# Patient Record
Sex: Female | Born: 2000 | Hispanic: No | Marital: Single | Smoking: Never smoker
Health system: Southern US, Community
[De-identification: ages and names within clinical notes are randomized; demographics above are authoritative.]

---
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
## 2022-05-31 ENCOUNTER — Other Ambulatory Visit: Payer: Self-pay

## 2022-05-31 ENCOUNTER — Inpatient Hospital Stay (HOSPITAL_COMMUNITY)
Admission: EM | Admit: 2022-05-31 | Discharge: 2022-06-04 | DRG: 872 | Disposition: A | Discharge status: Home / Self Care | Payer: Medicaid Other | Attending: Internal Medicine | Admitting: Internal Medicine

## 2022-05-31 ENCOUNTER — Emergency Department (HOSPITAL_COMMUNITY): Payer: Medicaid Other

## 2022-05-31 ENCOUNTER — Encounter (HOSPITAL_COMMUNITY): Payer: Self-pay

## 2022-05-31 DIAGNOSIS — N3090 Cystitis, unspecified without hematuria: Secondary | ICD-10-CM | POA: Diagnosis present

## 2022-05-31 DIAGNOSIS — E876 Hypokalemia: Secondary | ICD-10-CM | POA: Diagnosis present

## 2022-05-31 DIAGNOSIS — R000 Tachycardia, unspecified: Secondary | ICD-10-CM | POA: Diagnosis not present

## 2022-05-31 DIAGNOSIS — N179 Acute kidney failure, unspecified: Secondary | ICD-10-CM | POA: Diagnosis present

## 2022-05-31 DIAGNOSIS — N12 Tubulo-interstitial nephritis, not specified as acute or chronic: Secondary | ICD-10-CM | POA: Diagnosis present

## 2022-05-31 DIAGNOSIS — A419 Sepsis, unspecified organism: Secondary | ICD-10-CM | POA: Diagnosis not present

## 2022-05-31 DIAGNOSIS — R739 Hyperglycemia, unspecified: Secondary | ICD-10-CM | POA: Diagnosis not present

## 2022-05-31 DIAGNOSIS — Z833 Family history of diabetes mellitus: Secondary | ICD-10-CM | POA: Diagnosis not present

## 2022-05-31 DIAGNOSIS — D649 Anemia, unspecified: Secondary | ICD-10-CM | POA: Diagnosis not present

## 2022-05-31 DIAGNOSIS — E872 Acidosis, unspecified: Secondary | ICD-10-CM | POA: Diagnosis not present

## 2022-05-31 DIAGNOSIS — Z1152 Encounter for screening for COVID-19: Secondary | ICD-10-CM | POA: Diagnosis not present

## 2022-05-31 DIAGNOSIS — R109 Unspecified abdominal pain: Secondary | ICD-10-CM | POA: Diagnosis not present

## 2022-05-31 DIAGNOSIS — N151 Renal and perinephric abscess: Secondary | ICD-10-CM | POA: Diagnosis present

## 2022-05-31 DIAGNOSIS — R059 Cough, unspecified: Secondary | ICD-10-CM | POA: Diagnosis not present

## 2022-05-31 DIAGNOSIS — R519 Headache, unspecified: Secondary | ICD-10-CM | POA: Diagnosis not present

## 2022-05-31 DIAGNOSIS — R6520 Severe sepsis without septic shock: Secondary | ICD-10-CM | POA: Diagnosis not present

## 2022-05-31 DIAGNOSIS — R079 Chest pain, unspecified: Secondary | ICD-10-CM | POA: Diagnosis not present

## 2022-05-31 DIAGNOSIS — N3289 Other specified disorders of bladder: Secondary | ICD-10-CM | POA: Diagnosis not present

## 2022-05-31 DIAGNOSIS — R509 Fever, unspecified: Secondary | ICD-10-CM | POA: Diagnosis not present

## 2022-05-31 LAB — COMPREHENSIVE METABOLIC PANEL
ALT: 16 U/L (ref 0–44)
AST: 29 U/L (ref 15–41)
Albumin: 4 g/dL (ref 3.5–5.0)
Alkaline Phosphatase: 72 U/L (ref 38–126)
Anion gap: 14 (ref 5–15)
BUN: 12 mg/dL (ref 6–20)
CO2: 21 mmol/L — ABNORMAL LOW (ref 22–32)
Calcium: 9.4 mg/dL (ref 8.9–10.3)
Chloride: 99 mmol/L (ref 98–111)
Creatinine, Ser: 1.31 mg/dL — ABNORMAL HIGH (ref 0.44–1.00)
GFR, Estimated: 60 mL/min — ABNORMAL LOW (ref >60)
Glucose, Bld: 190 mg/dL — ABNORMAL HIGH (ref 70–99)
Potassium: 3.3 mmol/L — ABNORMAL LOW (ref 3.5–5.1)
Sodium: 134 mmol/L — ABNORMAL LOW (ref 135–145)
Total Bilirubin: 1.1 mg/dL (ref 0.3–1.2)
Total Protein: 7.9 g/dL (ref 6.5–8.1)

## 2022-05-31 LAB — LIPASE, BLOOD: Lipase: 25 U/L (ref 11–51)

## 2022-05-31 LAB — CBC WITH DIFFERENTIAL/PLATELET
Abs Immature Granulocytes: 0.26 10*3/uL — ABNORMAL HIGH (ref 0.00–0.07)
Basophils Absolute: 0 10*3/uL (ref 0.0–0.1)
Basophils Relative: 0 %
Eosinophils Absolute: 0.1 10*3/uL (ref 0.0–0.5)
Eosinophils Relative: 0 %
HCT: 39.7 % (ref 36.0–46.0)
Hemoglobin: 13.2 g/dL (ref 12.0–15.0)
Immature Granulocytes: 1 %
Lymphocytes Relative: 15 %
Lymphs Abs: 3.2 10*3/uL (ref 0.7–4.0)
MCH: 30.6 pg (ref 26.0–34.0)
MCHC: 33.2 g/dL (ref 30.0–36.0)
MCV: 92.1 fL (ref 80.0–100.0)
Monocytes Absolute: 0.6 10*3/uL (ref 0.1–1.0)
Monocytes Relative: 3 %
Neutro Abs: 16.7 10*3/uL — ABNORMAL HIGH (ref 1.7–7.7)
Neutrophils Relative %: 81 %
Platelets: 287 10*3/uL (ref 150–400)
RBC: 4.31 MIL/uL (ref 3.87–5.11)
RDW: 12.5 % (ref 11.5–15.5)
WBC: 20.8 10*3/uL — ABNORMAL HIGH (ref 4.0–10.5)
nRBC: 0 % (ref 0.0–0.2)

## 2022-05-31 LAB — URINALYSIS, ROUTINE W REFLEX MICROSCOPIC
Bilirubin Urine: NEGATIVE
Glucose, UA: NEGATIVE mg/dL
Ketones, ur: 20 mg/dL — AB
Nitrite: NEGATIVE
Protein, ur: 100 mg/dL — AB
RBC / HPF: >50 RBC/hpf — ABNORMAL HIGH (ref 0–5)
Specific Gravity, Urine: 1.02 (ref 1.005–1.030)
WBC, UA: >50 WBC/hpf — ABNORMAL HIGH (ref 0–5)
pH: 5 (ref 5.0–8.0)

## 2022-05-31 LAB — LACTIC ACID, PLASMA
Lactic Acid, Venous: 4.3 mmol/L (ref 0.5–1.9)
Lactic Acid, Venous: 2 mmol/L (ref 0.5–1.9)

## 2022-05-31 LAB — RESP PANEL BY RT-PCR (FLU A&B, COVID) ARPGX2
Influenza A by PCR: NEGATIVE
Influenza B by PCR: NEGATIVE
SARS Coronavirus 2 by RT PCR: NEGATIVE

## 2022-05-31 LAB — I-STAT BETA HCG BLOOD, ED (MC, WL, AP ONLY): I-stat hCG, quantitative: <5 m[IU]/mL (ref <5)

## 2022-05-31 MED ORDER — LACTATED RINGERS IV SOLN: INTRAVENOUS | Status: AC

## 2022-05-31 MED ORDER — ACETAMINOPHEN 500 MG PO TABS
1000.0000 mg | ORAL_TABLET | Freq: Once | ORAL | Status: AC
  Administered 2022-05-31: 1000 mg via ORAL
  Filled 2022-05-31: qty 2

## 2022-05-31 MED ORDER — SODIUM CHLORIDE 0.9 % IV SOLN
1.0000 g | INTRAVENOUS | Status: DC
  Administered 2022-06-01: 1 g via INTRAVENOUS
  Filled 2022-05-31: qty 10

## 2022-05-31 MED ORDER — ACETAMINOPHEN 650 MG RE SUPP: 650.0000 mg | Freq: Four times a day (QID) | RECTAL | Status: DC | PRN

## 2022-05-31 MED ORDER — LACTATED RINGERS IV BOLUS (SEPSIS)
250.0000 mL | Freq: Once | INTRAVENOUS | Status: AC
  Administered 2022-05-31: 250 mL via INTRAVENOUS

## 2022-05-31 MED ORDER — LACTATED RINGERS IV BOLUS
1000.0000 mL | Freq: Once | INTRAVENOUS | Status: AC
  Administered 2022-05-31: 1000 mL via INTRAVENOUS

## 2022-05-31 MED ORDER — ONDANSETRON HCL 4 MG PO TABS: 4.0000 mg | ORAL_TABLET | Freq: Four times a day (QID) | ORAL | Status: DC | PRN

## 2022-05-31 MED ORDER — ENOXAPARIN SODIUM 40 MG/0.4ML IJ SOSY
40.0000 mg | PREFILLED_SYRINGE | INTRAMUSCULAR | Status: DC
  Administered 2022-05-31 – 2022-06-03 (×3): 40 mg via SUBCUTANEOUS
  Filled 2022-05-31 (×3): qty 0.4

## 2022-05-31 MED ORDER — CEFTRIAXONE SODIUM 1 G IJ SOLR: 1.0000 g | Freq: Once | INTRAMUSCULAR | Status: DC

## 2022-05-31 MED ORDER — SODIUM CHLORIDE 0.9 % IV SOLN
2.0000 g | Freq: Once | INTRAVENOUS | Status: AC
  Administered 2022-05-31: 2 g via INTRAVENOUS
  Filled 2022-05-31: qty 20

## 2022-05-31 MED ORDER — ACETAMINOPHEN 500 MG PO TABS: 1000.0000 mg | ORAL_TABLET | Freq: Once | ORAL | Status: DC

## 2022-05-31 MED ORDER — ONDANSETRON HCL 4 MG/2ML IJ SOLN
4.0000 mg | Freq: Four times a day (QID) | INTRAMUSCULAR | Status: DC | PRN
  Administered 2022-05-31: 4 mg via INTRAVENOUS
  Filled 2022-05-31: qty 2

## 2022-05-31 MED ORDER — IOHEXOL 350 MG/ML SOLN
75.0000 mL | Freq: Once | INTRAVENOUS | Status: AC | PRN
  Administered 2022-05-31: 75 mL via INTRAVENOUS

## 2022-05-31 MED ORDER — ACETAMINOPHEN 325 MG PO TABS
650.0000 mg | ORAL_TABLET | Freq: Four times a day (QID) | ORAL | Status: DC | PRN
  Administered 2022-05-31 – 2022-06-04 (×8): 650 mg via ORAL
  Filled 2022-05-31 (×8): qty 2

## 2022-05-31 MED ORDER — LACTATED RINGERS IV BOLUS
500.0000 mL | Freq: Once | INTRAVENOUS | Status: AC
  Administered 2022-05-31: 500 mL via INTRAVENOUS

## 2022-05-31 MED ORDER — LACTATED RINGERS IV BOLUS (SEPSIS)
500.0000 mL | Freq: Once | INTRAVENOUS | Status: AC
  Administered 2022-05-31: 500 mL via INTRAVENOUS

## 2022-05-31 NOTE — ED Provider Notes (Incomplete)
[facility] EMERGENCY DEPARTMENT Provider Note   CSN: [id number] Arrival date & time: 05/31/22  1238     History {Add pertinent medical, surgical, social history, OB history to HPI:1} Chief Complaint  Patient presents with   Abdominal Pain    Colleen Young is a 21 y.o. female.  Patient with no significant past medical history presents to the emergency department today for fever, headache, vomiting.  Symptoms started several days ago.  History obtained from patient as well as sister-in-law at bedside.  They report that took a home test for UTI about 2 weeks ago and treated this with over-the-counter Azo.  She reports having some abdominal pain recently, but not currently present.  She reports generalized headache.  No associated confusion, weakness, numbness, or tingling in the arms or the legs.  She reported taking a home UTI test yesterday which was positive.  No cough, shortness of breath or difficulty breathing.  No diarrhea or constipation.  No skin rashes.  No recent travel, suspicious food or water exposures.  No known sick contacts.      Home Medications Prior to Admission medications   Not on File      Allergies    Patient has no known allergies.    Review of Systems   Review of Systems  Physical Exam Updated Vital Signs BP (!) 105/58   Pulse (!) 172   Temp (!) 102.4 F (39.1 C)   Resp 15   Ht 4\' 11"  (1.499 m)   SpO2 98%  Physical Exam Vitals and nursing note reviewed.  Constitutional:      General: She is not in acute distress.    Appearance: She is well-developed.     Comments: Patient diaphoretic but did just receive Tylenol for fever.  HENT:     Head: Normocephalic and atraumatic.     Right Ear: External ear normal.     Left Ear: External ear normal.     Nose: Nose normal.     Mouth/Throat:     Mouth: Mucous membranes are moist.  Eyes:     Extraocular Movements: Extraocular movements intact.     Conjunctiva/sclera: Conjunctivae  normal.  Neck:     Meningeal: Brudzinski's sign and Kernig's sign absent.     Comments: Patient with full range of motion of neck without difficulty, no meningeal signs. Cardiovascular:     Rate and Rhythm: Regular rhythm. Tachycardia present.     Heart sounds: No murmur heard. Pulmonary:     Effort: No respiratory distress.     Breath sounds: No wheezing, rhonchi or rales.  Abdominal:     Palpations: Abdomen is soft.     Tenderness: There is no abdominal tenderness. There is no right CVA tenderness, left CVA tenderness, guarding or rebound. Negative signs include Murphy's sign and McBurney's sign.  Musculoskeletal:     Cervical back: Normal range of motion and neck supple.     Right lower leg: No edema.     Left lower leg: No edema.  Skin:    General: Skin is warm and dry.     Findings: No rash.  Neurological:     General: No focal deficit present.     Mental Status: She is alert. Mental status is at baseline.     Motor: No weakness.  Psychiatric:        Mood and Affect: Mood normal.    ED Results / Procedures / Treatments   Labs (all labs ordered are listed,  but only abnormal results are displayed) Labs Reviewed  CBC WITH DIFFERENTIAL/PLATELET - Abnormal; Notable for the following components:      Result Value   WBC 20.8 (*)    Neutro Abs 16.7 (*)    Abs Immature Granulocytes 0.26 (*)    All other components within normal limits  LACTIC ACID, PLASMA - Abnormal; Notable for the following components:   Lactic Acid, Venous 4.3 (*)    All other components within normal limits  RESP PANEL BY RT-PCR (FLU A&B, COVID) ARPGX2  CULTURE, BLOOD (ROUTINE X 2)  CULTURE, BLOOD (ROUTINE X 2)  COMPREHENSIVE METABOLIC PANEL  LIPASE, BLOOD  URINALYSIS, ROUTINE W REFLEX MICROSCOPIC  LACTIC ACID, PLASMA  I-STAT BETA HCG BLOOD, ED ([facility], [facility], [facility] ONLY)    EKG EKG Interpretation  Date/Time:  Sunday May 31 2022 12:51:31 EST Ventricular Rate:  172 PR Interval:  82 QRS  Duration: 78 QT Interval:  246 QTC Calculation: 416 R Axis:   88 Text Interpretation: Sinus tachycardia with short PR Marked ST abnormality, possible inferior subendocardial injury Abnormal ECG No previous ECGs available no prior ECG for comaprison. No STEMI Confirmed by Theda Belfast ([id number]) on 05/31/2022 1:11:51 PM  Radiology DG Chest 2 View  Result Date: 05/31/2022 CLINICAL DATA:  Cough and fever.  Abdominal pain for 2 days. EXAM: CHEST - 2 VIEW COMPARISON:  08/21/2008. FINDINGS: Normal heart, mediastinum and hila. Clear lungs.  No pleural effusion or pneumothorax. Skeletal structures are within normal limits. IMPRESSION: Normal chest radiographs. Electronically Signed   By: Amie Portland M.D.   On: 05/31/2022 13:48    Procedures Procedures  {Document cardiac monitor, telemetry assessment procedure when appropriate:1}  Medications Ordered in ED Medications  cefTRIAXone (ROCEPHIN) 1 g in sodium chloride 0.9 % 100 mL IVPB (has no administration in time range)  lactated ringers bolus 500 mL (has no administration in time range)    And  lactated ringers bolus 250 mL (has no administration in time range)  acetaminophen (TYLENOL) tablet 1,000 mg (1,000 mg Oral Given 05/31/22 1304)  lactated ringers bolus 1,000 mL (1,000 mLs Intravenous New Bag/Given 05/31/22 1309)    ED Course/ Medical Decision Making/ A&P    Patient seen and examined. History obtained directly from patient.   Labs/EKG: Ordered sepsis work-up including UA and COVID/flu.  Imaging: Personally reviewed and interpreted chest x-ray, agree negative.  Medications/Fluids: Ordered: IV fluid bolus.   Most recent vital signs reviewed and are as follows: BP (!) 105/58   Pulse (!) 172   Temp (!) 102.4 F (39.1 C)   Resp 15   Ht 4\' 11"  (1.499 m)   SpO2 98%   Initial impression: Sepsis, unclear cause  1:54 PM Reassessment performed. Patient appears stable.  Labs personally reviewed and interpreted including: CBC  with white blood cell count 20.8, normal hemoglobin; lactate 4.3 indicating severe sepsis; flu and COVID-negative; pregnancy negative.  Reviewed pertinent lab work and imaging with patient at bedside. Questions answered.   Most current vital signs reviewed and are as follows: BP (!) 105/58   Pulse (!) 172   Temp (!) 102.4 F (39.1 C)   Resp 15   Ht 4\' 11"  (1.499 m)   SpO2 98%   Plan: Code sepsis ordered, will ensure 30 cc/kg fluid bolus.  1 L ordered, patient will likely need around 1750 cc.  Awaiting remainder of labs.  Patient discussed with and seen by Dr. , agrees no meningeal signs at this time.  3:35 PM Reassessment performed.  Patient appears stable, heart rate improved to 110.  Reviewed pertinent lab work and imaging with patient at bedside. Questions answered.   Most current vital signs reviewed and are as follows: BP (!) 91/54   Pulse (!) 104   Temp (!) 102.4 F (39.1 C)   Resp (!) 36   Ht 4\' 11"  (1.499 m)   Wt 49.9 kg   SpO2 99%   BMI 22.22 kg/m   Plan: Admit after UA.  Patient aware of need for UA.   CRITICAL Young Performed by: PA-C Total critical Young time: 40 minutes Critical Young time was exclusive of separately billable procedures and treating other patients. Critical Young was necessary to treat or prevent imminent or life-threatening deterioration. Critical Young was time spent personally by me on the following activities: development of treatment plan with patient and/or surrogate as well as nursing, discussions with consultants, evaluation of patient's response to treatment, examination of patient, obtaining history from patient or surrogate, ordering and performing treatments and interventions, ordering and review of laboratory studies, ordering and review of radiographic studies, pulse oximetry and re-evaluation of patient's condition.                           Medical Decision Making  Patient presents  {Document critical Young  time when appropriate:1} {Document review of labs and clinical decision tools ie heart score, Chads2Vasc2 etc:1}  {Document your independent review of radiology images, and any outside records:1} {Document your discussion with family members, caretakers, and with consultants:1} {Document social determinants of health affecting pt's Young:1} {Document your decision making why or why not admission, treatments were needed:1} Final Clinical Impression(s) / ED Diagnoses Final diagnoses:  Sepsis, due to unspecified organism, unspecified whether acute organ dysfunction present [facility])    Rx / DC Orders ED Discharge Orders     None

## 2022-05-31 NOTE — ED Notes (Signed)
The patient reports she just started her menstrual cycle today when she was in the restroom providing her urine sample. Dr. Lajean Silvius informed.

## 2022-05-31 NOTE — Hospital Course (Addendum)
Here with her sister in law. Headache, fever, headache. Symptoms all started 3 weeks ago. She had a uti then per at home test, hadn't previously had any. Tested because she had burning with urination. She started taking azo which helped burning sensation. She doesn't remember what happened before she came in but SIL says she had high fever last night and they tested for uti at home again it was positive. SIL gave her cystex at home took 2 last night and tylenol and ibuprofen. Fever as high as 104.4. no abdominal pain, yes n/v--threw up a few hours ago. Has had increased frequency. No dysuria right now. No back pain. Is still nauseous. Appetite is decreased. Has had chills.  No pmh No surgeries No home medications Social hx: no alcohol, no tobacco, no illicit substances. Lives with parents and brother. Has two brothers Family hx:m and d have diabetes, no other history  Is ill appearing, speaking softly  Menstruating now No vitamins No possibility for stis Been a while since seeing pcp  Full code  Headache is worst sx right now. Friend says she was a little altered this morning. Didn't know how she got here.

## 2022-05-31 NOTE — ED Provider Triage Note (Signed)
Emergency Medicine Provider Triage Evaluation Note  Nevah Ondrea Dow , a 21 y.o. female  was evaluated in triage.  Pt complains of fever.  Has been feeling unwell over the last few days.  Patient with headache, nausea, vomiting, dysuria.  No sick contacts however did recently travel to [facility].  No out of country travel.  She has diffuse abdominal pain.  No neck pain, neck rigidity  Review of Systems  Positive: Fever, headache, abdominal pain, dysuria Negative: Neck pain, neck stiffness  Physical Exam  BP (!) 105/58   Pulse (!) 172   Temp (!) 102.4 F (39.1 C)   Resp 15   Ht 4\' 11"  (1.499 m)   SpO2 98%  Gen:   Awake, pale. Rigors, diaphoretic  Neck:  Full ROM without difficulty Resp:  Normal effort  Cardiac: tachycardic MSK:   Moves extremities without difficulty  ABD:  Diffusely tender Other:    Medical Decision Making  Medically screening exam initiated at 12:52 PM.  Appropriate orders placed.  Riva Ma Shifrin was informed that the remainder of the evaluation will be completed by another provider, this initial triage assessment does not replace that evaluation, and the importance of remaining in the ED until their evaluation is complete.  Fever  Patient significantly tachycardic into 160's, pale, diaphoretic, rigors.  Nursing aware patient needs room in back   Loran Auguste A, PA-C 05/31/22 1253

## 2022-05-31 NOTE — ED Provider Notes (Signed)
  Assumption of Care    I assumed care of Colleen Young on 05/31/2022 at 3 PM from [place], [place].   Briefly, Colleen Young is a 21 y.o. female who presented for fever, mild UTI symptoms. The signout from the previous ED provider included: Clinical Course as of 05/31/22 1730  Sun May 31, 2022  1538 Sepsis, likely UTI, minor urinary sxs. VS improved after IVFs, S/p Rocephin. Will need admission. If UA unrevealing consider CTAP.  [DG]  1652 CTAP  1. Bilateral striated nephrograms, right greater than left, compatible with pyelonephritis. Lobar nephronia versus small abscess noted within the anterior cortex of the upper pole of right kidney. 2. Mild bladder wall thickening with urothelial enhancement compatible with cystitis. [DG]    Clinical Course User Index [DG] Stephanie Coup, MD     Please refer to the original provider's note for additional information regarding the care of Colleen Young.  Reassessment: Vital Signs:  The most current vitals were  Vitals:   05/31/22 1615 05/31/22 1723  BP: (!) 85/61 (!) 100/52  Pulse:  89  Resp: (!) 26 (!) 22  Temp:    SpO2:  99%    Physical Exam:  Hemodynamics:  The patient has soft but overall stable BP.  Mental Status:  The patient is alert. PE: No acute distress.  Appears slightly fatigued, but overall nontoxic-appearing.  No significant abdominal tenderness to palpation.   Additional MDM: On my initial assessment, patient appears stable and states that she feels a bit better at this time.  No significant abdominal pain.  She states that she wears tampons during menstruation, but has not used one for several weeks and does not currently have one.  She has no vaginal discharge or bleeding. Will obtain CTAP to further evaluate for possible pyelo or other infection.   CTAP obtained, demonstrates pyelonephritis and possible sent home right renal abscess, cystitis, independent review and review by radiology.  UA with negative nitrites, moderate  leukocytes, greater than 50 WBCs, rare bacteria, greater than 50 RBCs, patient reports she is currently menstruating, overall c/w UTI.  Lactic acid appropriately downtrending from 4.3-2.0 after 30 cc/KG IV fluids.  Blood pressure has gotten a bit soft again, 80s/50s, will administer another 500 cc of IV fluids.  This did improve her blood pressure, which I suspect is low at baseline given her young age and low weight.  Patient overall well-appearing.  Patient admitted to medicine teaching service for further management.  The plan for this patient was discussed with Dr. Silverio Lay, who voiced agreement and who oversaw evaluation and treatment of this patient.    Diagnosis: 1. Sepsis, due to unspecified organism, unspecified whether acute organ dysfunction present (HCC)   2. Pyelonephritis            Stephanie Coup, MD 05/31/22 1730    Charlynne Pander, MD 05/31/22 [phone]

## 2022-05-31 NOTE — ED Notes (Signed)
Provider notified that pt lactic is 4.3.

## 2022-05-31 NOTE — ED Notes (Signed)
Patient transported to CT 

## 2022-05-31 NOTE — ED Notes (Signed)
Pt ambulatory to restroom with independent steady gait °

## 2022-05-31 NOTE — H&P (Cosign Needed Addendum)
Date: 05/31/2022               Patient Name:  Colleen Young MRN: [id number]  DOB: [birth year] Age / Sex: 21 y.o., female   PCP: Pcp, No         Medical Service: Internal Medicine Teaching Service         Attending Physician: Dr. Dickie La, MD    First Contact: Adron Bene, MD Pager: Myriam Jacobson [phone]  Second Contact: Champ Mungo, DO Pager: ED [phone]       After Hours (After 5p/  First Contact Pager: [phone]  weekends / holidays): Second Contact Pager: [phone]   SUBJECTIVE   Chief Complaint: fever, dysuria  History of Present Illness: Colleen Young is a 21 y.o. female with no significant past medical history who presented to the ED with fever, vomiting, and dysuria. Here with her sister in law. Currently endorses headache and fever. Symptoms all started 2 weeks ago. She had a uti per at home test, hadn't previously had any. Tested because she had burning with urination. She started taking azo which helped burning sensation. She doesn't remember what happened before she came in but SIL says she had high fever last night and they tested for uti at home again and it was positive. SIL gave her cystex at home, took 2 last night as well as tylenol and ibuprofen. Endorses fever as high as 104.4. No abdominal pain, just  n/v-- says she threw up a few hours ago. Has had increased frequency. No dysuria right now. No back pain. Is still nauseous. Appetite is decreased. Has had chills. Headache is worst sx right now. SIL says she was a little altered this morning and that she doesn't know how she got here.  Denies any potential for STIs. Currently menstruating (as of today) but denies hematuria before today. No vitamins. States it's been a while since seeing pcp. She is a full code.  ED Course: Initially with fever, tachycardia, hypotension. CTAP with pyelonephritis. UA with negative nitrites, moderate leukocytes, greater than 50 WBCs, rare bacteria, greater than 50 RBCs, patient reports she is currently  menstruating, overall c/w UTI.  Lactic acid appropriately downtrending from 4.3-2.0 after 30 cc/KG IV fluids. Received Rocephin 2 g. BP improved with fluids. Temperature reduced with Rocephin.  PMHx None  Medications No home medications  Surgical Hx No surgeries  Social hx No alcohol, no tobacco, no illicit substances. Lives with parents and brother. Has two brothers PCP: None  Family hx Mother and father have diabetes, no other history that she is aware of  Allergies: Allergies as of 05/31/2022   (No Known Allergies)    Review of Systems: A complete ROS was negative except as per HPI.   OBJECTIVE:   Physical Exam: Blood pressure (!) 85/61, pulse (!) 101, temperature 100.1 F (37.8 C), temperature source Oral, resp. rate (!) 26, height 4\' 11"  (1.499 m), weight 49.9 kg, SpO2 99 %.  Constitutional: ill-appearing young female lying in hospital bed, in no acute distress HENT: normocephalic atraumatic, mucous membranes moist Eyes: conjunctiva non-erythematous Neck: supple Cardiovascular: regular rate and rhythm, no m/r/g Pulmonary/Chest: normal work of breathing on room air, lungs clear to auscultation bilaterally Abdominal: soft, non-tender, non-distended MSK: normal bulk and tone Neurological: alert & oriented x 3 Skin: warm and dry Psych: Normal mood and affect.  Labs: CBC    Component Value Date/Time   WBC 20.8 (H) 05/31/2022 1301   RBC 4.31 05/31/2022 1301   HGB 13.2 05/31/2022  1301   HCT 39.7 05/31/2022 1301   PLT 287 05/31/2022 1301   MCV 92.1 05/31/2022 1301   MCH 30.6 05/31/2022 1301   MCHC 33.2 05/31/2022 1301   RDW 12.5 05/31/2022 1301   LYMPHSABS 3.2 05/31/2022 1301   MONOABS 0.6 05/31/2022 1301   EOSABS 0.1 05/31/2022 1301   BASOSABS 0.0 05/31/2022 1301     CMP     Component Value Date/Time   NA 134 (L) 05/31/2022 1301   K 3.3 (L) 05/31/2022 1301   CL 99 05/31/2022 1301   CO2 21 (L) 05/31/2022 1301   GLUCOSE 190 (H) 05/31/2022 1301   BUN  12 05/31/2022 1301   CREATININE 1.31 (H) 05/31/2022 1301   CALCIUM 9.4 05/31/2022 1301   PROT 7.9 05/31/2022 1301   ALBUMIN 4.0 05/31/2022 1301   AST 29 05/31/2022 1301   ALT 16 05/31/2022 1301   ALKPHOS 72 05/31/2022 1301   BILITOT 1.1 05/31/2022 1301   GFRNONAA 60 (L) 05/31/2022 1301    Imaging: CT ABDOMEN PELVIS W CONTRAST  Result Date: 05/31/2022 CLINICAL DATA:  Urinary tract infection. Complains of nausea, fever and headache and abdominal pain for 2 days. Diaphoretic and pale. EXAM: CT ABDOMEN AND PELVIS WITH CONTRAST TECHNIQUE: Multidetector CT imaging of the abdomen and pelvis was performed using the standard protocol following bolus administration of intravenous contrast. RADIATION DOSE REDUCTION: This exam was performed according to the departmental dose-optimization program which includes automated exposure control, adjustment of the mA and/or kV according to patient size and/or use of iterative reconstruction technique. CONTRAST:  79mL OMNIPAQUE IOHEXOL 350 MG/ML SOLN COMPARISON:  None Available. FINDINGS: Lower chest: No acute abnormality. Hepatobiliary: No focal liver abnormality is seen. No gallstones, gallbladder wall thickening, or biliary dilatation. Pancreas: Unremarkable. No pancreatic ductal dilatation or surrounding inflammatory changes. Spleen: Normal in size without focal abnormality. Adrenals/Urinary Tract: Normal appearance of the adrenal glands. No nephrolithiasis. Bilateral striated nephrograms are identified, right greater than left. Lobar nephronia versus small abscess noted within the anterior cortex of the upper pole of right kidney measures 1.2 x 2.3 cm, image 33/3. Right-sided pelvocaliectasis. Bilateral urothelial enhancement is noted involving both ureters. Partially decompressed bladder with mild wall thickening. Stomach/Bowel: Stomach appears normal. The appendix is visualized and is within normal limits. No bowel wall thickening, inflammation or distension.  Vascular/Lymphatic: Normal appearance of the abdominal aorta. No sign of abdominopelvic adenopathy. Reproductive: Uterus and bilateral adnexa are unremarkable. Other: No abdominal wall hernia or abnormality. No abdominopelvic ascites. Musculoskeletal: No acute or significant osseous findings. IMPRESSION: 1. Bilateral striated nephrograms, right greater than left, compatible with pyelonephritis. Lobar nephronia versus small abscess noted within the anterior cortex of the upper pole of right kidney. 2. Mild bladder wall thickening with urothelial enhancement compatible with cystitis. Electronically Signed   By: Signa Kell M.D.   On: 05/31/2022 16:48   DG Chest 2 View  Result Date: 05/31/2022 CLINICAL DATA:  Cough and fever.  Abdominal pain for 2 days. EXAM: CHEST - 2 VIEW COMPARISON:  08/21/2008. FINDINGS: Normal heart, mediastinum and hila. Clear lungs.  No pleural effusion or pneumothorax. Skeletal structures are within normal limits. IMPRESSION: Normal chest radiographs. Electronically Signed   By: Amie Portland M.D.   On: 05/31/2022 13:48    EKG: personally reviewed my interpretation is sinus tachycardia  ASSESSMENT & PLAN:   Assessment & Plan by Problem: Principal Problem:   Pyelonephritis   Colleen Young is a 21 y.o. female with no significant past medical history who presented  to the ED with fever, vomiting, and dysuria admitted for bilateral pyelonephritis.   #Sepsis 2/2 Bilateral pyelonephritis #Cystitis #Possible AKI Patient presents with 2 weeks of fever and dysuria. WBC 20.8, febrile. UA negative for nitrite, mod leukocytes, >50 WBC, >50 RBC. Patient is menstruating. CTAP with bilateral pyelonephritis, lobar nephronia vs small abscess at rt kidney, and cystitis. Clinically improving after receiving IV Rocephin and fluids. Lactate downtrending. BP improving. Blood, urine cultures pending. -continue IV rocephin 1g daily, narrow pending sensitivities -maintenance LR -f/u urine,  blood cultures -daily CBC  Possible AKI Cr 1.31, possible AKI in setting of UTI and reduced po intake, but no baseline. Receiving IVF. -trend Cr  Hypokalemia Potassium 3.3 in setting of reduced po intake. -maintenance LR -daily BMP  Diet: Normal VTE: Enoxaparin IVF: LR,Bolus Code: Full  Prior to Admission Living Arrangement: Home, living with parents and brother Anticipated Discharge Location: Home Barriers to Discharge: medical management  Dispo: Admit patient to Inpatient with expected length of stay greater than 2 midnights.  Signed: Adron Bene, MD Internal Medicine Resident PGY-1  05/31/2022, 6:41 PM

## 2022-05-31 NOTE — Sepsis Progress Note (Signed)
Sepsis protocol is being followed by [facility]. 

## 2022-05-31 NOTE — ED Triage Notes (Signed)
Pt arrived POV from home c/o N/V, a fever, a headache and some belly pain x2 days. Pt is diaphoretic and pale. Pt also endorses some burning with urination.

## 2022-06-01 DIAGNOSIS — N12 Tubulo-interstitial nephritis, not specified as acute or chronic: Secondary | ICD-10-CM | POA: Diagnosis not present

## 2022-06-01 LAB — COMPREHENSIVE METABOLIC PANEL
ALT: 14 U/L (ref 0–44)
AST: 21 U/L (ref 15–41)
Albumin: 2.7 g/dL — ABNORMAL LOW (ref 3.5–5.0)
Alkaline Phosphatase: 51 U/L (ref 38–126)
Anion gap: 9 (ref 5–15)
BUN: 6 mg/dL (ref 6–20)
CO2: 23 mmol/L (ref 22–32)
Calcium: 8.2 mg/dL — ABNORMAL LOW (ref 8.9–10.3)
Chloride: 106 mmol/L (ref 98–111)
Creatinine, Ser: 0.7 mg/dL (ref 0.44–1.00)
GFR, Estimated: >60 mL/min (ref >60)
Glucose, Bld: 96 mg/dL (ref 70–99)
Potassium: 3.5 mmol/L (ref 3.5–5.1)
Sodium: 138 mmol/L (ref 135–145)
Total Bilirubin: 0.5 mg/dL (ref 0.3–1.2)
Total Protein: 5.6 g/dL — ABNORMAL LOW (ref 6.5–8.1)

## 2022-06-01 LAB — HIV ANTIBODY (ROUTINE TESTING W REFLEX): HIV Screen 4th Generation wRfx: NONREACTIVE

## 2022-06-01 LAB — CBC
HCT: 32.1 % — ABNORMAL LOW (ref 36.0–46.0)
Hemoglobin: 10.6 g/dL — ABNORMAL LOW (ref 12.0–15.0)
MCH: 30.5 pg (ref 26.0–34.0)
MCHC: 33 g/dL (ref 30.0–36.0)
MCV: 92.5 fL (ref 80.0–100.0)
Platelets: 187 10*3/uL (ref 150–400)
RBC: 3.47 MIL/uL — ABNORMAL LOW (ref 3.87–5.11)
RDW: 12.8 % (ref 11.5–15.5)
WBC: 18.5 10*3/uL — ABNORMAL HIGH (ref 4.0–10.5)
nRBC: 0 % (ref 0.0–0.2)

## 2022-06-01 MED ORDER — IBUPROFEN 400 MG PO TABS
400.0000 mg | ORAL_TABLET | ORAL | Status: DC | PRN
  Administered 2022-06-01 – 2022-06-04 (×3): 400 mg via ORAL
  Filled 2022-06-01 (×3): qty 1

## 2022-06-01 MED ORDER — POLYETHYLENE GLYCOL 3350 17 G PO PACK: 17.0000 g | PACK | Freq: Every day | ORAL | Status: DC | PRN

## 2022-06-01 MED ORDER — PIPERACILLIN-TAZOBACTAM 3.375 G IVPB 30 MIN
3.3750 g | Freq: Once | INTRAVENOUS | Status: AC
  Administered 2022-06-01: 3.375 g via INTRAVENOUS
  Filled 2022-06-01: qty 50

## 2022-06-01 MED ORDER — LACTATED RINGERS IV BOLUS
1000.0000 mL | Freq: Once | INTRAVENOUS | Status: AC
  Administered 2022-06-01: 1000 mL via INTRAVENOUS

## 2022-06-01 MED ORDER — MELATONIN 3 MG PO TABS: 3.0000 mg | ORAL_TABLET | Freq: Every evening | ORAL | Status: DC | PRN

## 2022-06-01 MED ORDER — PIPERACILLIN-TAZOBACTAM 3.375 G IVPB
3.3750 g | Freq: Three times a day (TID) | INTRAVENOUS | Status: DC
  Administered 2022-06-02 – 2022-06-03 (×5): 3.375 g via INTRAVENOUS
  Filled 2022-06-01 (×8): qty 50

## 2022-06-01 NOTE — Progress Notes (Addendum)
HD#1 Subjective:   Summary: Colleen Young is a 21 y.o. female with no significant past medical history who presented to the ED with fever, vomiting, and dysuria.   Overnight Events: A few low MAPs (59, 61) got 1L bolus LR, now 70s to 80s. Febrile to 104, now afebrile.  Patient states she feels better today. Still with mild headache though much improved. Endorses a little bit of dizziness. Endorses good amount of voiding. No back pain or abdominal pain. Has neck pain--points to center upper C spine. Had some chills this morning around 5am but none currently. Last BM ~5 days ago. Doesn't normally go very often so this isn't necessarily abnormal for her.   Objective:  Vital signs in last 24 hours: Vitals:   06/01/22 0800 06/01/22 1006 06/01/22 1030 06/01/22 1100  BP: 115/63 (!) 97/55 (!) 100/58 105/62  Pulse: 83 67 61 66  Resp: (!) 21 (!) 26 20 (!) 22  Temp:  99.1 F (37.3 C)    TempSrc:  Oral    SpO2: 96% 100% 100% 99%  Weight:      Height:       Supplemental O2: Room Air SpO2: 99 %   Physical Exam:  Constitutional: mildly ill-appearing female sitting in hospital bed, in no acute distress, non-diaphoretic HENT: normocephalic atraumatic, mucous membranes moist Eyes: conjunctiva non-erythematous Neck: supple, no pain when touching chin to chest, no pain to palpation of c-spine Cardiovascular: regular rate and rhythm, no m/r/g Pulmonary/Chest: normal work of breathing on room air, lungs clear to auscultation bilaterally Abdominal: soft, non-distended, pain with palpation of right mid/upper quadrant, no rebound or guarding MSK: normal bulk and tone Neurological: alert & oriented x 3 Skin: warm and dry, normal skin turgor Psych: Normal mood and affect  Filed Weights   05/31/22 1512  Weight: 49.9 kg     Intake/Output Summary (Last 24 hours) at 06/01/2022 1151 Last data filed at 06/01/2022 0442 Gross per 24 hour  Intake 953.06 ml  Output --  Net 953.06 ml   Net IO  Since Admission: 953.06 mL [06/01/22 1151]  Pertinent Labs:    Latest Ref Rng & Units 06/01/2022    4:40 AM 05/31/2022    1:01 PM  CBC  WBC 4.0 - 10.5 K/uL 18.5  20.8   Hemoglobin 12.0 - 15.0 g/dL 35.7  01.7   Hematocrit 36.0 - 46.0 % 32.1  39.7   Platelets 150 - 400 K/uL 187  287        Latest Ref Rng & Units 06/01/2022    4:40 AM 05/31/2022    1:01 PM  CMP  Glucose 70 - 99 mg/dL 96  793   BUN 6 - 20 mg/dL 6  12   Creatinine 9.03 - 1.00 mg/dL 0.09  2.33   Sodium 007 - 145 mmol/L 138  134   Potassium 3.5 - 5.1 mmol/L 3.5  3.3   Chloride 98 - 111 mmol/L 106  99   CO2 22 - 32 mmol/L 23  21   Calcium 8.9 - 10.3 mg/dL 8.2  9.4   Total Protein 6.5 - 8.1 g/dL 5.6  7.9   Total Bilirubin 0.3 - 1.2 mg/dL 0.5  1.1   Alkaline Phos 38 - 126 U/L 51  72   AST 15 - 41 U/L 21  29   ALT 0 - 44 U/L 14  16     Imaging: CT ABDOMEN PELVIS W CONTRAST  Result Date: 05/31/2022 CLINICAL DATA:  Urinary  tract infection. Complains of nausea, fever and headache and abdominal pain for 2 days. Diaphoretic and pale. EXAM: CT ABDOMEN AND PELVIS WITH CONTRAST TECHNIQUE: Multidetector CT imaging of the abdomen and pelvis was performed using the standard protocol following bolus administration of intravenous contrast. RADIATION DOSE REDUCTION: This exam was performed according to the departmental dose-optimization program which includes automated exposure control, adjustment of the mA and/or kV according to patient size and/or use of iterative reconstruction technique. CONTRAST:  37mL OMNIPAQUE IOHEXOL 350 MG/ML SOLN COMPARISON:  None Available. FINDINGS: Lower chest: No acute abnormality. Hepatobiliary: No focal liver abnormality is seen. No gallstones, gallbladder wall thickening, or biliary dilatation. Pancreas: Unremarkable. No pancreatic ductal dilatation or surrounding inflammatory changes. Spleen: Normal in size without focal abnormality. Adrenals/Urinary Tract: Normal appearance of the adrenal glands. No  nephrolithiasis. Bilateral striated nephrograms are identified, right greater than left. Lobar nephronia versus small abscess noted within the anterior cortex of the upper pole of right kidney measures 1.2 x 2.3 cm, image 33/3. Right-sided pelvocaliectasis. Bilateral urothelial enhancement is noted involving both ureters. Partially decompressed bladder with mild wall thickening. Stomach/Bowel: Stomach appears normal. The appendix is visualized and is within normal limits. No bowel wall thickening, inflammation or distension. Vascular/Lymphatic: Normal appearance of the abdominal aorta. No sign of abdominopelvic adenopathy. Reproductive: Uterus and bilateral adnexa are unremarkable. Other: No abdominal wall hernia or abnormality. No abdominopelvic ascites. Musculoskeletal: No acute or significant osseous findings. IMPRESSION: 1. Bilateral striated nephrograms, right greater than left, compatible with pyelonephritis. Lobar nephronia versus small abscess noted within the anterior cortex of the upper pole of right kidney. 2. Mild bladder wall thickening with urothelial enhancement compatible with cystitis. Electronically Signed   By: Signa Kell M.D.   On: 05/31/2022 16:48   DG Chest 2 View  Result Date: 05/31/2022 CLINICAL DATA:  Cough and fever.  Abdominal pain for 2 days. EXAM: CHEST - 2 VIEW COMPARISON:  08/21/2008. FINDINGS: Normal heart, mediastinum and hila. Clear lungs.  No pleural effusion or pneumothorax. Skeletal structures are within normal limits. IMPRESSION: Normal chest radiographs. Electronically Signed   By: Amie Portland M.D.   On: 05/31/2022 13:48    Assessment/Plan:   Principal Problem:   Pyelonephritis   Patient Summary: Colleen Young is a 21 y.o. female with no significant past medical history who presented to the ED with fever, vomiting, and dysuria admitted for sepsis 2/2 bilateral pyelonephritis.   Bilateral Pyelonephritis with nephronia vs abscess Cystitis Sepsis,  resolved Patient presents with 2 weeks of fever and dysuria. WBC stable, afebrile (though did have fever briefly overnight which responded to tylenol). Clinically improving with IV Rocephin and fluids (3.25 L so far). HDS now. Awaiting culture results. Will treat as complicated UTI with 10-14 day abx course. If her condition does not continue to improve, will consider IR consult to assess need for abscess drainage.  -continue IV rocephin 1g daily, narrow pending sensitivities -Tylenol for fever -zofran for nausea -holding IVF -f/u urine, blood cultures -daily CBC  AKI Cr improved with 1.31 to 0.70 with IVF. -IVF prn -daily BMP  Hypokalemia Corrected with LR. -daily BMP  Hyperglycemia No history of diabetes.  -A1c pending  Diet: Normal IVF: LR,100cc/hr VTE: Enoxaparin Code: Full Family Update: SIL   Dispo: Anticipated discharge to Home in 3 days pending medical management of pyelonephritis.   Adron Bene MD Internal Medicine Resident PGY-1 Please contact the on call pager after 5 pm and on weekends at [phone].

## 2022-06-01 NOTE — ED Notes (Signed)
Pt is reporting headache. Temp spiked to 104 orally and was give tylenol but it did not help with her headache. Dr. Cliffton Asters informed via epic secure chat.

## 2022-06-01 NOTE — Progress Notes (Signed)
Pt ambulate from stretcher to bed. Pt A&Ox4 . Bed in lowest position with bed alarms in place, personal items and call bell within reach.

## 2022-06-01 NOTE — ED Notes (Signed)
ED TO INPATIENT HANDOFF REPORT  ED Nurse Name and Phone #: Morrie Sheldon RN [phone]   S Name/Age/Gender Colleen Young 21 y.o. female Room/Bed: 018C/018C  Code Status   Code Status: Full Code  Home/SNF/Other Home Patient oriented to: self, place, time, and situation Is this baseline? Yes   Triage Complete: Triage complete  Chief Complaint Pyelonephritis [N12]  Triage Note Pt arrived POV from home c/o N/V, a fever, a headache and some belly pain x2 days. Pt is diaphoretic and pale. Pt also endorses some burning with urination.    Allergies No Known Allergies  Level of Care/Admitting Diagnosis ED Disposition     ED Disposition  Admit   Condition  --   Comment  Hospital Area: [facility] [[id number]]  Level of Care: Progressive [102]  Admit to Progressive based on following criteria: MULTISYSTEM THREATS such as stable sepsis, metabolic/electrolyte imbalance with or without encephalopathy that is responding to early treatment.  May admit patient to [facility] or [facility] if equivalent level of care is available:: No  Covid Evaluation: Asymptomatic - no recent exposure (last 10 days) testing not required  Diagnosis: Pyelonephritis [540086]  Admitting Physician: Dickie La [[id number]]  Attending Physician: Dickie La [[id number]]  Certification:: I certify this patient will need inpatient services for at least 2 midnights  Estimated Length of Stay: 2          B Medical/Surgery History History reviewed. No pertinent past medical history. History reviewed. No pertinent surgical history.   A IV Location/Drains/Wounds Patient Lines/Drains/Airways Status     Active Line/Drains/Airways     Name Placement date Placement time Site Days   Peripheral IV 05/31/22 20 G Left Antecubital 05/31/22  1451  Antecubital  1            Intake/Output Last 24 hours  Intake/Output Summary (Last 24 hours) at 06/01/2022 1647 Last data filed at 06/01/2022 0442 Gross  per 24 hour  Intake 953.06 ml  Output --  Net 953.06 ml    Labs/Imaging Results for orders placed or performed during the hospital encounter of 05/31/22 (from the past 48 hour(s))  Urinalysis, Routine w reflex microscopic Urine, Clean Catch     Status: Abnormal   Collection Time: 05/31/22 12:44 PM  Result Value Ref Range   Color, Urine AMBER (A) YELLOW    Comment: BIOCHEMICALS MAY BE AFFECTED BY COLOR   APPearance CLOUDY (A) CLEAR   Specific Gravity, Urine 1.020 1.005 - 1.030   pH 5.0 5.0 - 8.0   Glucose, UA NEGATIVE NEGATIVE mg/dL   Hgb urine dipstick LARGE (A) NEGATIVE   Bilirubin Urine NEGATIVE NEGATIVE   Ketones, ur 20 (A) NEGATIVE mg/dL   Protein, ur 580 (A) NEGATIVE mg/dL   Nitrite NEGATIVE NEGATIVE   Leukocytes,Ua MODERATE (A) NEGATIVE   RBC / HPF >50 (H) 0 - 5 RBC/hpf   WBC, UA >50 (H) 0 - 5 WBC/hpf   Bacteria, UA RARE (A) NONE SEEN   Squamous Epithelial / LPF 0-5 0 - 5   WBC Clumps PRESENT    Mucus PRESENT     Comment: Performed at [facility], [address] [place], [place] [address]  Resp Panel by RT-PCR (Flu A&B, Covid) Anterior Nasal Swab     Status: None   Collection Time: 05/31/22 12:47 PM   Specimen: Anterior Nasal Swab  Result Value Ref Range   SARS Coronavirus 2 by RT PCR NEGATIVE NEGATIVE    Comment: (NOTE) SARS-CoV-2 target nucleic  acids are NOT DETECTED.  The SARS-CoV-2 RNA is generally detectable in upper respiratory specimens during the acute phase of infection. The lowest concentration of SARS-CoV-2 viral copies this assay can detect is 138 copies/mL. A negative result does not preclude SARS-Cov-2 infection and should not be used as the sole basis for treatment or other patient management decisions. A negative result may occur with  improper specimen collection/handling, submission of specimen other than nasopharyngeal swab, presence of viral mutation(s) within the areas targeted by this assay, and inadequate number of viral copies(<138  copies/mL). A negative result must be combined with clinical observations, patient history, and epidemiological information. The expected result is Negative.  Fact Sheet for Patients:  BloggerCourse.com  Fact Sheet for Healthcare Providers:  SeriousBroker.it  This test is no t yet approved or cleared by the [place] FDA and  has been authorized for detection and/or diagnosis of SARS-CoV-2 by FDA under an Emergency Use Authorization (EUA). This EUA will remain  in effect (meaning this test can be used) for the duration of the COVID-19 declaration under Section 564(b)(1) of the Act, 21 U.S.C.section 360bbb-3(b)(1), unless the authorization is terminated  or revoked sooner.       Influenza A by PCR NEGATIVE NEGATIVE   Influenza B by PCR NEGATIVE NEGATIVE    Comment: (NOTE) The Xpert Xpress SARS-CoV-2/FLU/RSV plus assay is intended as an aid in the diagnosis of influenza from Nasopharyngeal swab specimens and should not be used as a sole basis for treatment. Nasal washings and aspirates are unacceptable for Xpert Xpress SARS-CoV-2/FLU/RSV testing.  Fact Sheet for Patients: BloggerCourse.com  Fact Sheet for Healthcare Providers: SeriousBroker.it  This test is not yet approved or cleared by the [place] FDA and has been authorized for detection and/or diagnosis of SARS-CoV-2 by FDA under an Emergency Use Authorization (EUA). This EUA will remain in effect (meaning this test can be used) for the duration of the COVID-19 declaration under Section 564(b)(1) of the Act, 21 U.S.C. section 360bbb-3(b)(1), unless the authorization is terminated or revoked.  Performed at [facility], [address] [place], [place] [address]   Lactic acid, plasma     Status: Abnormal   Collection Time: 05/31/22 12:57 PM  Result Value Ref Range   Lactic Acid, Venous 4.3 (HH) 0.5 - 1.9  mmol/L    Comment: CRITICAL RESULT CALLED TO, READ BACK BY AND VERIFIED WITH M,FRANCIS RN @1351  05/31/22 E,BENTON Performed at [facility], [address], [place], [place]   CBC with Differential     Status: Abnormal   Collection Time: 05/31/22  1:01 PM  Result Value Ref Range   WBC 20.8 (H) 4.0 - 10.5 K/uL   RBC 4.31 3.87 - 5.11 MIL/uL   Hemoglobin 13.2 12.0 - 15.0 g/dL   HCT 13/12/23 27.0 - 62.3 %   MCV 92.1 80.0 - 100.0 fL   MCH 30.6 26.0 - 34.0 pg   MCHC 33.2 30.0 - 36.0 g/dL   RDW 76.2 83.1 - 51.7 %   Platelets 287 150 - 400 K/uL   nRBC 0.0 0.0 - 0.2 %   Neutrophils Relative % 81 %   Neutro Abs 16.7 (H) 1.7 - 7.7 K/uL   Lymphocytes Relative 15 %   Lymphs Abs 3.2 0.7 - 4.0 K/uL   Monocytes Relative 3 %   Monocytes Absolute 0.6 0.1 - 1.0 K/uL   Eosinophils Relative 0 %   Eosinophils Absolute 0.1 0.0 - 0.5 K/uL   Basophils  Relative 0 %   Basophils Absolute 0.0 0.0 - 0.1 K/uL   Immature Granulocytes 1 %   Abs Immature Granulocytes 0.26 (H) 0.00 - 0.07 K/uL    Comment: Performed at [facility], [address] [place], [place] [address]  Comprehensive metabolic panel     Status: Abnormal   Collection Time: 05/31/22  1:01 PM  Result Value Ref Range   Sodium 134 (L) 135 - 145 mmol/L   Potassium 3.3 (L) 3.5 - 5.1 mmol/L   Chloride 99 98 - 111 mmol/L   CO2 21 (L) 22 - 32 mmol/L   Glucose, Bld 190 (H) 70 - 99 mg/dL    Comment: Glucose reference range applies only to samples taken after fasting for at least 8 hours.   BUN 12 6 - 20 mg/dL   Creatinine, Ser 4.09 (H) 0.44 - 1.00 mg/dL   Calcium 9.4 8.9 - 81.1 mg/dL   Total Protein 7.9 6.5 - 8.1 g/dL   Albumin 4.0 3.5 - 5.0 g/dL   AST 29 15 - 41 U/L   ALT 16 0 - 44 U/L   Alkaline Phosphatase 72 38 - 126 U/L   Total Bilirubin 1.1 0.3 - 1.2 mg/dL   GFR, Estimated 60 (L) >60 mL/min    Comment: (NOTE) Calculated using the CKD-EPI Creatinine Equation (2021)    Anion gap 14 5 - 15    Comment: Performed at  [facility], [address] [place], [place] [address]  Lipase, blood     Status: None   Collection Time: 05/31/22  1:01 PM  Result Value Ref Range   Lipase 25 11 - 51 U/L    Comment: Performed at [facility], [address] [place], [place] [address]  Blood culture (routine x 2)     Status: None (Preliminary result)   Collection Time: 05/31/22  1:01 PM   Specimen: BLOOD  Result Value Ref Range   Specimen Description BLOOD RIGHT ANTECUBITAL    Special Requests      BOTTLES DRAWN AEROBIC AND ANAEROBIC Blood Culture adequate volume   Culture      NO GROWTH < 24 HOURS Performed at [facility], [address] [place], [place] [address]    Report Status PENDING   I-Stat Beta hCG blood, ED ([facility], [facility], [facility] only)     Status: None   Collection Time: 05/31/22  1:09 PM  Result Value Ref Range   I-stat hCG, quantitative <5.0 <5 mIU/mL   Comment 3            Comment:   GEST. AGE      CONC.  (mIU/mL)   <=1 WEEK        5 - 50     2 WEEKS       50 - 500     3 WEEKS       100 - 10,000     4 WEEKS     1,000 - 30,000        FEMALE AND NON-PREGNANT FEMALE:     LESS THAN 5 mIU/mL   Lactic acid, plasma     Status: Abnormal   Collection Time: 05/31/22  2:12 PM  Result Value Ref Range   Lactic Acid, Venous 2.0 (HH) 0.5 - 1.9 mmol/L    Comment: CRITICAL VALUE NOTED. VALUE IS CONSISTENT WITH PREVIOUSLY REPORTED/CALLED VALUE Performed at [facility], [address], [place], [place] [address]   Blood  culture (routine x 2)     Status: None (Preliminary result)   Collection Time: 05/31/22  2:47 PM   Specimen: BLOOD LEFT ARM  Result Value Ref Range   Specimen Description BLOOD LEFT ARM    Special Requests      BOTTLES DRAWN AEROBIC AND ANAEROBIC Blood Culture results may not be optimal due to an excessive volume of blood received in culture bottles   Culture      NO GROWTH < 24 HOURS Performed at [facility], [address], [place], [place] [address]    Report  Status PENDING   Comprehensive metabolic panel     Status: Abnormal   Collection Time: 06/01/22  4:40 AM  Result Value Ref Range   Sodium 138 135 - 145 mmol/L   Potassium 3.5 3.5 - 5.1 mmol/L   Chloride 106 98 - 111 mmol/L   CO2 23 22 - 32 mmol/L   Glucose, Bld 96 70 - 99 mg/dL    Comment: Glucose reference range applies only to samples taken after fasting for at least 8 hours.   BUN 6 6 - 20 mg/dL   Creatinine, Ser 6.38 0.44 - 1.00 mg/dL   Calcium 8.2 (L) 8.9 - 10.3 mg/dL   Total Protein 5.6 (L) 6.5 - 8.1 g/dL   Albumin 2.7 (L) 3.5 - 5.0 g/dL   AST 21 15 - 41 U/L   ALT 14 0 - 44 U/L   Alkaline Phosphatase 51 38 - 126 U/L   Total Bilirubin 0.5 0.3 - 1.2 mg/dL   GFR, Estimated >75 >64 mL/min    Comment: (NOTE) Calculated using the CKD-EPI Creatinine Equation (2021)    Anion gap 9 5 - 15    Comment: Performed at [facility], [address] [place], [place] [address]  CBC     Status: Abnormal   Collection Time: 06/01/22  4:40 AM  Result Value Ref Range   WBC 18.5 (H) 4.0 - 10.5 K/uL   RBC 3.47 (L) 3.87 - 5.11 MIL/uL   Hemoglobin 10.6 (L) 12.0 - 15.0 g/dL   HCT 18.8 (L) 41.6 - 60.6 %   MCV 92.5 80.0 - 100.0 fL   MCH 30.5 26.0 - 34.0 pg   MCHC 33.0 30.0 - 36.0 g/dL   RDW 30.1 60.1 - 09.3 %   Platelets 187 150 - 400 K/uL   nRBC 0.0 0.0 - 0.2 %    Comment: Performed at [facility], [address], [place], [place] [address]   CT ABDOMEN PELVIS W CONTRAST  Result Date: 05/31/2022 CLINICAL DATA:  Urinary tract infection. Complains of nausea, fever and headache and abdominal pain for 2 days. Diaphoretic and pale. EXAM: CT ABDOMEN AND PELVIS WITH CONTRAST TECHNIQUE: Multidetector CT imaging of the abdomen and pelvis was performed using the standard protocol following bolus administration of intravenous contrast. RADIATION DOSE REDUCTION: This exam was performed according to the departmental dose-optimization program which includes automated exposure control, adjustment  of the mA and/or kV according to patient size and/or use of iterative reconstruction technique. CONTRAST:  49mL OMNIPAQUE IOHEXOL 350 MG/ML SOLN COMPARISON:  None Available. FINDINGS: Lower chest: No acute abnormality. Hepatobiliary: No focal liver abnormality is seen. No gallstones, gallbladder wall thickening, or biliary dilatation. Pancreas: Unremarkable. No pancreatic ductal dilatation or surrounding inflammatory changes. Spleen: Normal in size without focal abnormality. Adrenals/Urinary Tract: Normal appearance of the adrenal glands. No nephrolithiasis. Bilateral striated nephrograms are identified, right greater than left. Lobar nephronia  versus small abscess noted within the anterior cortex of the upper pole of right kidney measures 1.2 x 2.3 cm, image 33/3. Right-sided pelvocaliectasis. Bilateral urothelial enhancement is noted involving both ureters. Partially decompressed bladder with mild wall thickening. Stomach/Bowel: Stomach appears normal. The appendix is visualized and is within normal limits. No bowel wall thickening, inflammation or distension. Vascular/Lymphatic: Normal appearance of the abdominal aorta. No sign of abdominopelvic adenopathy. Reproductive: Uterus and bilateral adnexa are unremarkable. Other: No abdominal wall hernia or abnormality. No abdominopelvic ascites. Musculoskeletal: No acute or significant osseous findings. IMPRESSION: 1. Bilateral striated nephrograms, right greater than left, compatible with pyelonephritis. Lobar nephronia versus small abscess noted within the anterior cortex of the upper pole of right kidney. 2. Mild bladder wall thickening with urothelial enhancement compatible with cystitis. Electronically Signed   By: Signa Kell M.D.   On: 05/31/2022 16:48   DG Chest 2 View  Result Date: 05/31/2022 CLINICAL DATA:  Cough and fever.  Abdominal pain for 2 days. EXAM: CHEST - 2 VIEW COMPARISON:  08/21/2008. FINDINGS: Normal heart, mediastinum and hila. Clear  lungs.  No pleural effusion or pneumothorax. Skeletal structures are within normal limits. IMPRESSION: Normal chest radiographs. Electronically Signed   By: Amie Portland M.D.   On: 05/31/2022 13:48    Pending Labs Unresulted Labs (From admission, onward)     Start     Ordered   06/02/22 0500  Hemoglobin A1c  Tomorrow morning,   R        06/01/22 1156   06/02/22 0500  CBC with Differential/Platelet  Tomorrow morning,   R        06/01/22 1156   06/02/22 0500  Comprehensive metabolic panel  Tomorrow morning,   R        06/01/22 1156   06/01/22 0500  HIV Antibody (routine testing w rflx)  (HIV Antibody (Routine testing w reflex) panel)  Once,   R        05/31/22 1749   05/31/22 1844  Urine Culture  (Urine Culture)  Add-on,   AD       Question:  Indication  Answer:  Dysuria   05/31/22 1844            Vitals/Pain Today's Vitals   06/01/22 1357 06/01/22 1400 06/01/22 1518 06/01/22 1545  BP:  108/67  104/69  Pulse:  (!) 113  94  Resp:  (!) 27  17  Temp: (!) 103.3 F (39.6 C)  (!) 100.8 F (38.2 C)   TempSrc: Oral  Oral   SpO2:  98%  100%  Weight:      Height:      PainSc:        Isolation Precautions No active isolations  Medications Medications  enoxaparin (LOVENOX) injection 40 mg (40 mg Subcutaneous Given 05/31/22 1806)  lactated ringers infusion (0 mLs Intravenous Stopped 06/01/22 0408)  acetaminophen (TYLENOL) tablet 650 mg (650 mg Oral Given 06/01/22 1400)    Or  acetaminophen (TYLENOL) suppository 650 mg ( Rectal See Alternative 06/01/22 1400)  ondansetron (ZOFRAN) tablet 4 mg ( Oral See Alternative 05/31/22 2301)    Or  ondansetron (ZOFRAN) injection 4 mg (4 mg Intravenous Given 05/31/22 2301)  melatonin tablet 3 mg (has no administration in time range)  polyethylene glycol (MIRALAX / GLYCOLAX) packet 17 g (has no administration in time range)  piperacillin-tazobactam (ZOSYN) IVPB 3.375 g (0 g Intravenous Stopped 06/01/22 1647)    Followed by   piperacillin-tazobactam (ZOSYN) IVPB 3.375 g (has no  administration in time range)  ibuprofen (ADVIL) tablet 400 mg (400 mg Oral Given 06/01/22 1612)  acetaminophen (TYLENOL) tablet 1,000 mg (1,000 mg Oral Given 05/31/22 1304)  lactated ringers bolus 1,000 mL (0 mLs Intravenous Stopped 05/31/22 1440)  lactated ringers bolus 500 mL (0 mLs Intravenous Stopped 05/31/22 1541)    And  lactated ringers bolus 250 mL (0 mLs Intravenous Stopped 05/31/22 1555)  cefTRIAXone (ROCEPHIN) 2 g in sodium chloride 0.9 % 100 mL IVPB (0 g Intravenous Stopped 05/31/22 1541)  lactated ringers bolus 500 mL (0 mLs Intravenous Stopped 05/31/22 1749)  iohexol (OMNIPAQUE) 350 MG/ML injection 75 mL (75 mLs Intravenous Contrast Given 05/31/22 1637)  lactated ringers bolus 1,000 mL (0 mLs Intravenous Stopped 06/01/22 0307)    Mobility walks Low fall risk   Focused Assessments Renal Assessment Handoff:   R Recommendations: See Admitting Provider Note  Report given to:   Additional Notes:

## 2022-06-01 NOTE — ED Notes (Signed)
Internal resident paged, Dr. Marijo Conception.

## 2022-06-02 DIAGNOSIS — N12 Tubulo-interstitial nephritis, not specified as acute or chronic: Secondary | ICD-10-CM | POA: Diagnosis not present

## 2022-06-02 LAB — IRON AND TIBC
Iron: 19 ug/dL — ABNORMAL LOW (ref 28–170)
Saturation Ratios: 8 % — ABNORMAL LOW (ref 10.4–31.8)
TIBC: 251 ug/dL (ref 250–450)
UIBC: 232 ug/dL

## 2022-06-02 LAB — COMPREHENSIVE METABOLIC PANEL
ALT: 12 U/L (ref 0–44)
AST: 14 U/L — ABNORMAL LOW (ref 15–41)
Albumin: 2.6 g/dL — ABNORMAL LOW (ref 3.5–5.0)
Alkaline Phosphatase: 46 U/L (ref 38–126)
Anion gap: 7 (ref 5–15)
BUN: 5 mg/dL — ABNORMAL LOW (ref 6–20)
CO2: 23 mmol/L (ref 22–32)
Calcium: 8.4 mg/dL — ABNORMAL LOW (ref 8.9–10.3)
Chloride: 108 mmol/L (ref 98–111)
Creatinine, Ser: 0.66 mg/dL (ref 0.44–1.00)
GFR, Estimated: >60 mL/min (ref >60)
Glucose, Bld: 104 mg/dL — ABNORMAL HIGH (ref 70–99)
Potassium: 3.3 mmol/L — ABNORMAL LOW (ref 3.5–5.1)
Sodium: 138 mmol/L (ref 135–145)
Total Bilirubin: 0.5 mg/dL (ref 0.3–1.2)
Total Protein: 5.6 g/dL — ABNORMAL LOW (ref 6.5–8.1)

## 2022-06-02 LAB — CBC WITH DIFFERENTIAL/PLATELET
Abs Immature Granulocytes: 0.1 10*3/uL — ABNORMAL HIGH (ref 0.00–0.07)
Basophils Absolute: 0 10*3/uL (ref 0.0–0.1)
Basophils Relative: 0 %
Eosinophils Absolute: 0 10*3/uL (ref 0.0–0.5)
Eosinophils Relative: 0 %
HCT: 29.6 % — ABNORMAL LOW (ref 36.0–46.0)
Hemoglobin: 9.7 g/dL — ABNORMAL LOW (ref 12.0–15.0)
Immature Granulocytes: 1 %
Lymphocytes Relative: 10 %
Lymphs Abs: 1.6 10*3/uL (ref 0.7–4.0)
MCH: 30.6 pg (ref 26.0–34.0)
MCHC: 32.8 g/dL (ref 30.0–36.0)
MCV: 93.4 fL (ref 80.0–100.0)
Monocytes Absolute: 1.6 10*3/uL — ABNORMAL HIGH (ref 0.1–1.0)
Monocytes Relative: 10 %
Neutro Abs: 13.2 10*3/uL — ABNORMAL HIGH (ref 1.7–7.7)
Neutrophils Relative %: 79 %
Platelets: 199 10*3/uL (ref 150–400)
RBC: 3.17 MIL/uL — ABNORMAL LOW (ref 3.87–5.11)
RDW: 12.5 % (ref 11.5–15.5)
WBC: 16.6 10*3/uL — ABNORMAL HIGH (ref 4.0–10.5)
nRBC: 0 % (ref 0.0–0.2)

## 2022-06-02 LAB — HEMOGLOBIN A1C
Hgb A1c MFr Bld: 4.3 % — ABNORMAL LOW (ref 4.8–5.6)
Mean Plasma Glucose: 76.71 mg/dL

## 2022-06-02 LAB — URINE CULTURE: Culture: <10000 — AB

## 2022-06-02 LAB — FERRITIN: Ferritin: 144 ng/mL (ref 11–307)

## 2022-06-02 MED ORDER — IBUPROFEN 400 MG PO TABS
200.0000 mg | ORAL_TABLET | Freq: Once | ORAL | Status: AC
  Administered 2022-06-02: 200 mg via ORAL

## 2022-06-02 MED ORDER — POTASSIUM CHLORIDE CRYS ER 20 MEQ PO TBCR
40.0000 meq | EXTENDED_RELEASE_TABLET | Freq: Two times a day (BID) | ORAL | Status: AC
  Administered 2022-06-02 (×2): 40 meq via ORAL
  Filled 2022-06-02 (×2): qty 2

## 2022-06-02 MED ORDER — POLYETHYLENE GLYCOL 3350 17 G PO PACK
17.0000 g | PACK | Freq: Every day | ORAL | Status: DC
  Administered 2022-06-02 – 2022-06-04 (×3): 17 g via ORAL
  Filled 2022-06-02 (×3): qty 1

## 2022-06-02 NOTE — Progress Notes (Addendum)
HD#2 Subjective:   Summary: Colleen Young is a 21 y.o. female with no significant past medical history who presented to the ED with fever, vomiting, and dysuria.   Overnight Events: patient received ibuprofen for fever of 102.7. MAPs in 60s, one 61.  Patient continues to endorse improvement. Reports he headache, sore neck, and abdominal pain are improved. She says she has not had a bowel movement in 7 days. Hasn't eaten much due to not liking hospital food but discussed bringing in something she likes from outside hospital. Has intermittent headaches chronically. This one is similar, at temples and posteriorly, feels like squeezing. Endorses chills, diaphoresis, feeling cold but not currently. Had nausea and vomiting but no emesis since yesterday  Objective:  Vital signs in last 24 hours: Vitals:   06/02/22 0000 06/02/22 0222 06/02/22 0437 06/02/22 0800  BP: 113/65 (!) 98/55 (!) 96/46 94/65  Pulse: 83 67 (!) 53 (!) 55  Resp: 18 20 19 20   Temp: (!) 102.7 F (39.3 C) 99.2 F (37.3 C) 98.1 F (36.7 C)   TempSrc: Oral Oral Oral Oral  SpO2: 100% 99% 99% 100%  Weight:      Height:       Supplemental O2: Room Air SpO2: 100 %   Physical Exam:  Constitutional: mildly ill-appearing female sitting in hospital bed, in no acute distress, non-diaphoretic HENT: normocephalic atraumatic, mucous membranes moist Eyes: conjunctiva non-erythematous Neck: supple Pulmonary/Chest: normal work of breathing on room air Abdominal: soft, non-distended, dullness to percussion, pain with palpation of right mid/upper quadrant, no rebound or guarding MSK: normal bulk and tone Neurological: alert & oriented x 3 Skin: warm and dry, normal skin turgor Psych: Normal mood and affect  Filed Weights   05/31/22 1512  Weight: 49.9 kg    No intake or output data in the 24 hours ending 06/02/22 1118  Net IO Since Admission: 953.06 mL [06/02/22 1118]  Pertinent Labs:    Latest Ref Rng & Units  06/02/2022    2:58 AM 06/01/2022    4:40 AM 05/31/2022    1:01 PM  CBC  WBC 4.0 - 10.5 K/uL 16.6  18.5  20.8   Hemoglobin 12.0 - 15.0 g/dL 9.7  13/06/2022  37.1   Hematocrit 36.0 - 46.0 % 29.6  32.1  39.7   Platelets 150 - 400 K/uL 199  187  287        Latest Ref Rng & Units 06/02/2022    2:58 AM 06/01/2022    4:40 AM 05/31/2022    1:01 PM  CMP  Glucose 70 - 99 mg/dL 13/06/2022  96  694   BUN 6 - 20 mg/dL 5  6  12    Creatinine 0.44 - 1.00 mg/dL 854   6.27   Sodium 135 - 145 mmol/L 138  138  134   Potassium 3.5 - 5.1 mmol/L 3.3  3.5  3.3   Chloride 98 - 111 mmol/L 108  106  99   CO2 22 - 32 mmol/L 23  23  21    Calcium 8.9 - 10.3 mg/dL 8.4  8.2  9.4   Total Protein 6.5 - 8.1 g/dL 5.6  5.6  7.9   Total Bilirubin 0.3 - 1.2 mg/dL 0.5  0.5  1.1   Alkaline Phos 38 - 126 U/L 46  51  72   AST 15 - 41 U/L 14  21  29    ALT 0 - 44 U/L 12  14  16  Imaging: No results found.  Assessment/Plan:   Principal Problem:   Pyelonephritis   Patient Summary: Colleen Young is a 21 y.o. female with no significant past medical history who presented to the ED with fever, vomiting, and dysuria admitted for sepsis 2/2 bilateral pyelonephritis.   Bilateral Pyelonephritis with nephronia vs abscess Cystitis Sepsis, resolved Mild improvement in WBC, afebrile (though did have fever briefly overnight which responded to ibuprofen), last 3 MAPs 61, 69, 70. Feels better. No back pain. Blood cx with no growth <24 hrs. Initial urine culture negative, but will repeat with GC/Chlamydia cytology. It is possible that initial culture was collected after first dose of ceftriaxone. Will treat as complicated UTI with 10-14 day abx course. If her condition does not continue to improve, will consider repeat imaging and IR consult to assess need for abscess drainage.  -IV Zosyn -Tylenol/IBU for fever -zofran for nausea -holding IVF -f/u urine, blood cultures -daily CBC  Normocytic Anemia Patient without anemia  initially, hgb 13.2. After several liters of fluids, Hgb 10.6 and then 9.7. No signs of bleeding on CTAP or physical exam. Occasionally with mild hypotension though young, thin female also with infection. Given normocytic, with drop in HCT from 40% to 30%, feel this most likely dilutional. -daily CBC  AKI Cr improved with 1.31 to 0.66 with IVF. -IVF prn -daily BMP  Hypokalemia K 3.3. Will provide kcl 40 meq tabs x 2. -daily BMP  Hyperglycemia No history of diabetes. A1c wnl.  Diet: Normal IVF: none VTE: Enoxaparin Code: Full Family Update: SIL   Dispo: Anticipated discharge to Home in 3 days pending medical management of pyelonephritis.   Adron Bene MD Internal Medicine Resident PGY-1 Please contact the on call pager after 5 pm and on weekends at [phone].

## 2022-06-02 NOTE — Plan of Care (Signed)
  Problem: Education: Goal: Knowledge of General Education information will improve Description: Including pain rating scale, medication(s)/side effects and non-pharmacologic comfort measures Outcome: Progressing   Problem: Clinical Measurements: Goal: Diagnostic test results will improve Outcome: Progressing   

## 2022-06-02 NOTE — Progress Notes (Signed)
   06/02/22 0000  Assess: MEWS Score  Temp (!) 102.7 F (39.3 C)  BP 113/65  MAP (mmHg) 80  Pulse Rate 83  ECG Heart Rate 83  Resp 18  Level of Consciousness Alert  SpO2 100 %  O2 Device Room Air  Assess: MEWS Score  MEWS Temp 2  MEWS Systolic 0  MEWS Pulse 0  MEWS RR 0  MEWS LOC 0  MEWS Score 2  MEWS Score Color Yellow  Assess: if the MEWS score is Yellow or Red  Were vital signs taken at a resting state? Yes  Focused Assessment No change from prior assessment  Does the patient meet 2 or more of the SIRS criteria? Yes  Does the patient have a confirmed or suspected source of infection? Yes  MEWS guidelines implemented *See Row Information* No, other (Comment)  Treat  Pain Scale 0-10  Pain Score 0  Take Vital Signs  Increase Vital Sign Frequency  Yellow: Q 2hr X 2 then Q 4hr X 2, if remains yellow, continue Q 4hrs  Notify: Provider  Provider Name/Title Nooruddin,MD  Date Provider Notified 06/02/22  Time Provider Notified 0040  Method of Notification  (Secure chat)  Notification Reason  (High temp)  Provider response See new orders (Ordered Tab.Ibuprofen 200mg )  Date of Provider Response 06/02/22  Time of Provider Response 0042  Assess: SIRS CRITERIA  SIRS Temperature  1  SIRS Pulse 0  SIRS Respirations  0  SIRS WBC 1  SIRS Score Sum  2

## 2022-06-03 ENCOUNTER — Inpatient Hospital Stay (HOSPITAL_COMMUNITY): Payer: Medicaid Other

## 2022-06-03 DIAGNOSIS — Z872 Personal history of diseases of the skin and subcutaneous tissue: Secondary | ICD-10-CM | POA: Diagnosis not present

## 2022-06-03 DIAGNOSIS — N12 Tubulo-interstitial nephritis, not specified as acute or chronic: Secondary | ICD-10-CM | POA: Diagnosis not present

## 2022-06-03 DIAGNOSIS — N151 Renal and perinephric abscess: Secondary | ICD-10-CM | POA: Diagnosis not present

## 2022-06-03 LAB — CBC WITH DIFFERENTIAL/PLATELET
Abs Immature Granulocytes: 0.06 10*3/uL (ref 0.00–0.07)
Basophils Absolute: 0 10*3/uL (ref 0.0–0.1)
Basophils Relative: 0 %
Eosinophils Absolute: 0 10*3/uL (ref 0.0–0.5)
Eosinophils Relative: 0 %
HCT: 34.3 % — ABNORMAL LOW (ref 36.0–46.0)
Hemoglobin: 11.4 g/dL — ABNORMAL LOW (ref 12.0–15.0)
Immature Granulocytes: 1 %
Lymphocytes Relative: 21 %
Lymphs Abs: 2 10*3/uL (ref 0.7–4.0)
MCH: 30.3 pg (ref 26.0–34.0)
MCHC: 33.2 g/dL (ref 30.0–36.0)
MCV: 91.2 fL (ref 80.0–100.0)
Monocytes Absolute: 1.1 10*3/uL — ABNORMAL HIGH (ref 0.1–1.0)
Monocytes Relative: 11 %
Neutro Abs: 6.5 10*3/uL (ref 1.7–7.7)
Neutrophils Relative %: 67 %
Platelets: 261 10*3/uL (ref 150–400)
RBC: 3.76 MIL/uL — ABNORMAL LOW (ref 3.87–5.11)
RDW: 12.5 % (ref 11.5–15.5)
WBC: 9.7 10*3/uL (ref 4.0–10.5)
nRBC: 0 % (ref 0.0–0.2)

## 2022-06-03 LAB — BASIC METABOLIC PANEL
Anion gap: 10 (ref 5–15)
BUN: 6 mg/dL (ref 6–20)
CO2: 24 mmol/L (ref 22–32)
Calcium: 8.9 mg/dL (ref 8.9–10.3)
Chloride: 104 mmol/L (ref 98–111)
Creatinine, Ser: 0.56 mg/dL (ref 0.44–1.00)
GFR, Estimated: >60 mL/min (ref >60)
Glucose, Bld: 95 mg/dL (ref 70–99)
Potassium: 3.9 mmol/L (ref 3.5–5.1)
Sodium: 138 mmol/L (ref 135–145)

## 2022-06-03 LAB — MOLECULAR ANCILLARY ONLY
Chlamydia: NEGATIVE
Comment: NEGATIVE
Comment: NORMAL
Neisseria Gonorrhea: NEGATIVE

## 2022-06-03 MED ORDER — SODIUM CHLORIDE 0.9 % IV SOLN
1.0000 g | INTRAVENOUS | Status: DC
  Administered 2022-06-03: 1 g via INTRAVENOUS
  Filled 2022-06-03: qty 10

## 2022-06-03 NOTE — TOC Initial Note (Signed)
Transition of Care [facility]) - Initial/Assessment Note    Patient Details  Name: Colleen Young MRN: [id number] Date of Birth: Nov 25, 2000  [facility]) CM/SW Contact:    Harriet Masson, RN Phone Number: 06/03/2022, 12:15 PM  Clinical Narrative:                 Spoke to patient regarding PCP needs. Paitent is agreeable to [facility] making PCP apt. This RNCM sent request to CMA to make apt.  Patient can drive herself.  No other TOC needs at this time.   Expected Discharge Plan: Home/Self Care Barriers to Discharge: Continued Medical Work up   Patient Goals and CMS Choice Patient states their goals for this hospitalization and ongoing recovery are:: return home      Expected Discharge Plan and Services Expected Discharge Plan: Home/Self Care In-house Referral: PCP / [facility] Discharge Planning Services: Follow-up appt scheduled                                          Prior Living Arrangements/Services     Patient language and need for interpreter reviewed:: Yes Do you feel safe going back to the place where you live?: Yes      Need for Family Participation in Patient Care: Yes (Comment) Care giver support system in place?: Yes (comment)   Criminal Activity/Legal Involvement Pertinent to Current Situation/Hospitalization: No - Comment as needed  Activities of Daily Living      Permission Sought/Granted                  Emotional Assessment   Attitude/Demeanor/Rapport: Engaged Affect (typically observed): Accepting Orientation: : Oriented to Self, Oriented to Place, Oriented to  Time, Oriented to Situation Alcohol / Substance Use: Not Applicable Psych Involvement: No (comment)  Admission diagnosis:  Pyelonephritis [N12] Sepsis, due to unspecified organism, unspecified whether acute organ dysfunction present [facility]) [A41.9] Patient Active Problem List   Diagnosis Date Noted   Pyelonephritis 05/31/2022   Failed vision screen 05/28/2016    PCP:  Pcp, No Pharmacy:   [id number] [place], [place] - [address] AT [facility] [address] [facility] [address] [place] [address] Phone: [phone] Fax: [phone]     Social Determinants of Health (SDOH) Interventions    Readmission Risk Interventions     No data to display

## 2022-06-03 NOTE — Progress Notes (Addendum)
HD#3 Subjective:   Summary: Colleen Young is a 21 y.o. female with no significant past medical history who presented to the ED with fever, vomiting, and dysuria.   Overnight Events: Febrile to 102.9 at 10pm. Normal renal US. No abscess or hydro.  Patient states she is feeling much better today without a headache or neck pain. Endorses a little pain at her right lower abdomen, extending posteriorly to about the mid-axillary line (given where she was pointing). States menthol patches on forehead have helped her headache quite a bit. Denies history of dental disease, heart murmurs, rashes, unintended weight loss, travel outside the country, outdoor exposure, tick or bug bites. Denies dysuria.  Objective:  Vital signs in last 24 hours: Vitals:   06/02/22 2300 06/03/22 0300 06/03/22 0800 06/03/22 1324  BP: 113/64 101/61  115/75  Pulse: (!) 105 60 73 80  Resp: 20 18 20 17   Temp: (!) 102.9 F (39.4 C) 97.9 F (36.6 C)    TempSrc: Oral Oral    SpO2: 97%   99%  Weight:      Height:       Supplemental O2: Room Air SpO2: 99 %   Physical Exam:  Constitutional: well-appearing female sitting in hospital bed, in no acute distress, non-diaphoretic HENT: normocephalic atraumatic, mucous membranes moist Eyes: conjunctiva non-erythematous Neck: supple Cardiovascular: RRR, no m/r/g Pulmonary/Chest: normal work of breathing on room air, lungs CTAB Abdominal: soft, non-distended, pain with palpation of right mid/upper quadrant extending posteriorly to about the mid-axillary line, no rebound or guarding Neurological: alert & oriented x 3 Skin: warm and dry, normal skin turgor Psych: Normal mood and affect  Filed Weights   05/31/22 1512  Weight: 49.9 kg    No intake or output data in the 24 hours ending 06/03/22 1558  Net IO Since Admission: 953.06 mL [06/03/22 1558]  Pertinent Labs:    Latest Ref Rng & Units 06/03/2022    8:01 AM 06/02/2022    2:58 AM 06/01/2022    4:40 AM  CBC   WBC 4.0 - 10.5 K/uL 9.7  16.6  18.5   Hemoglobin 12.0 - 15.0 g/dL 06/03/2022  9.7  63.8   Hematocrit 36.0 - 46.0 % 34.3  29.6  32.1   Platelets 150 - 400 K/uL 261  199  187        Latest Ref Rng & Units 06/03/2022    8:01 AM 06/02/2022    2:58 AM 06/01/2022    4:40 AM  CMP  Glucose 70 - 99 mg/dL 95  06/03/2022  96   BUN 6 - 20 mg/dL 6  5  6    Creatinine 0.44 - 1.00 mg/dL 599   3.57   Sodium 135 - 145 mmol/L 138  138  138   Potassium 3.5 - 5.1 mmol/L 3.9  3.3  3.5   Chloride 98 - 111 mmol/L 104  108  106   CO2 22 - 32 mmol/L 24  23  23    Calcium 8.9 - 10.3 mg/dL 8.9  8.4  8.2   Total Protein 6.5 - 8.1 g/dL  5.6  5.6   Total Bilirubin 0.3 - 1.2 mg/dL  0.5  0.5   Alkaline Phos 38 - 126 U/L  46  51   AST 15 - 41 U/L  14  21   ALT 0 - 44 U/L  12  14     Iron/TIBC/Ferritin/ %Sat    Component Value Date/Time   IRON 19 (L)  06/02/2022 0439   TIBC 251 06/02/2022 0439   FERRITIN 144 06/02/2022 0439   IRONPCTSAT 8 (L) 06/02/2022 0439     Imaging: US RENAL  Result Date: 06/03/2022 CLINICAL DATA:  Right renal abscess on CT EXAM: RENAL / URINARY TRACT ULTRASOUND COMPLETE COMPARISON:  CT abdomen/pelvis dated 05/31/2022 FINDINGS: Right Kidney: Renal measurements: 12.8 x 4.2 x 4.9 cm = volume: 139 mL. Echogenicity within normal limits. No mass or hydronephrosis visualized. Left Kidney: Renal measurements: 11.5 x 5.4 x 4.6 cm = volume: 148 mL. Echogenicity within normal limits. No mass or hydronephrosis visualized. Bladder: Appears normal for degree of bladder distention. Other: None. IMPRESSION: Negative renal ultrasound. Specifically, no fluid collection/abscess is visualized in the right kidney. No hydronephrosis. Electronically Signed   By: Charline Bills M.D.   On: 06/03/2022 02:03    Assessment/Plan:   Principal Problem:   Pyelonephritis   Patient Summary: Colleen Young is a 21 y.o. female with no significant past medical history who presented to the ED with fever, vomiting, and  dysuria admitted for sepsis 2/2 bilateral pyelonephritis.   Bilateral Pyelonephritis  Cystitis Sepsis, resolved Still with occasional fever, though appears to be improving clinically. Renal US without abscess. Repeat cultures/cytology pending. Blood cultures no growth x 3 days. Will narrow antibiotics. May transition to po abx tomorrow if improvement continues. -IV Ceftriaxone -Tylenol/IBU for fever -zofran for nausea -f/u urine -daily CBC  Normocytic Anemia Iron 19. Pct sat 8. Ferritin 144. Iron deficiency vs anemia of chronic disease. Total iron deficit 775 mg. May need oral iron supplementation as outpatient. No immediate need for IV iron. Hgb improved today.  -daily CBC  AKI Cr at baseline. -IVF prn -daily BMP  Hypokalemia K 3.9.  -supplement as needed -daily BMP  Hyperglycemia No history of diabetes. A1c wnl.  Diet: Normal IVF: none VTE: Enoxaparin Code: Full Family Update: SIL   Dispo: Anticipated discharge to Home in 3 days pending medical management of pyelonephritis.   Adron Bene MD Internal Medicine Resident PGY-1 Please contact the on call pager after 5 pm and on weekends at [phone].

## 2022-06-03 NOTE — Progress Notes (Signed)
  Transition of Care (TOC) Screening Note   Patient Details  Name: Colleen Young Date of Birth: Apr 13, 2001   Transition of Care [facility]) CM/SW Contact:    Harriet Masson, RN Phone Number: 06/03/2022, 8:33 AM    Transition of Care Department [facility]) has reviewed patient and no TOC needs have been identified at this time. We will continue to monitor patient advancement through interdisciplinary progression rounds. If new patient transition needs arise, please place a TOC consult.

## 2022-06-04 ENCOUNTER — Encounter: Payer: Self-pay | Admitting: Internal Medicine

## 2022-06-04 DIAGNOSIS — N12 Tubulo-interstitial nephritis, not specified as acute or chronic: Secondary | ICD-10-CM | POA: Diagnosis not present

## 2022-06-04 DIAGNOSIS — R6520 Severe sepsis without septic shock: Secondary | ICD-10-CM

## 2022-06-04 DIAGNOSIS — A419 Sepsis, unspecified organism: Secondary | ICD-10-CM

## 2022-06-04 LAB — CBC WITH DIFFERENTIAL/PLATELET
Abs Immature Granulocytes: 0.06 10*3/uL (ref 0.00–0.07)
Basophils Absolute: 0.1 10*3/uL (ref 0.0–0.1)
Basophils Relative: 1 %
Eosinophils Absolute: 0.1 10*3/uL (ref 0.0–0.5)
Eosinophils Relative: 1 %
HCT: 34.9 % — ABNORMAL LOW (ref 36.0–46.0)
Hemoglobin: 11.3 g/dL — ABNORMAL LOW (ref 12.0–15.0)
Immature Granulocytes: 1 %
Lymphocytes Relative: 33 %
Lymphs Abs: 2.7 10*3/uL (ref 0.7–4.0)
MCH: 29.7 pg (ref 26.0–34.0)
MCHC: 32.4 g/dL (ref 30.0–36.0)
MCV: 91.8 fL (ref 80.0–100.0)
Monocytes Absolute: 1.1 10*3/uL — ABNORMAL HIGH (ref 0.1–1.0)
Monocytes Relative: 14 %
Neutro Abs: 4 10*3/uL (ref 1.7–7.7)
Neutrophils Relative %: 50 %
Platelets: 298 10*3/uL (ref 150–400)
RBC: 3.8 MIL/uL — ABNORMAL LOW (ref 3.87–5.11)
RDW: 12.5 % (ref 11.5–15.5)
WBC: 8.1 10*3/uL (ref 4.0–10.5)
nRBC: 0 % (ref 0.0–0.2)

## 2022-06-04 LAB — BASIC METABOLIC PANEL
Anion gap: 8 (ref 5–15)
BUN: 7 mg/dL (ref 6–20)
CO2: 22 mmol/L (ref 22–32)
Calcium: 8.6 mg/dL — ABNORMAL LOW (ref 8.9–10.3)
Chloride: 105 mmol/L (ref 98–111)
Creatinine, Ser: 0.5 mg/dL (ref 0.44–1.00)
GFR, Estimated: >60 mL/min (ref >60)
Glucose, Bld: 98 mg/dL (ref 70–99)
Potassium: 3.9 mmol/L (ref 3.5–5.1)
Sodium: 135 mmol/L (ref 135–145)

## 2022-06-04 LAB — URINE CULTURE: Culture: NO GROWTH

## 2022-06-04 MED ORDER — CEFADROXIL 500 MG PO CAPS
1000.0000 mg | ORAL_CAPSULE | Freq: Two times a day (BID) | ORAL | Status: DC
  Administered 2022-06-04: 1000 mg via ORAL
  Filled 2022-06-04 (×2): qty 2

## 2022-06-04 MED ORDER — CEFADROXIL 500 MG PO CAPS: 1000.0000 mg | ORAL_CAPSULE | Freq: Two times a day (BID) | ORAL | 0 refills | Status: AC

## 2022-06-04 MED ORDER — CIPROFLOXACIN HCL 500 MG PO TABS
500.0000 mg | ORAL_TABLET | Freq: Two times a day (BID) | ORAL | Status: DC
  Administered 2022-06-04: 500 mg via ORAL
  Filled 2022-06-04: qty 1

## 2022-06-04 MED ORDER — CEFADROXIL 500 MG PO CAPS: 1000.0000 mg | ORAL_CAPSULE | Freq: Two times a day (BID) | ORAL | 0 refills | Status: DC

## 2022-06-04 NOTE — Plan of Care (Signed)

## 2022-06-04 NOTE — Consult Note (Signed)
[facility]    Date of Admission:  05/31/2022     Reason for Consult: pyelo/renal abscess    Referring Provider: Sol Blazing    Abx: 11/16 cipro 11/13-15 piptazo 11/12-13 ceftriaxone        Assessment: Young 21 yo female otherwise healthy here with 2 weeks urinary tract infection sx that eventually progressed to pyelo   First episode, no hx stone  Clinically improved. Perhaps ascending involvement due to lack of early tx, but some question of vessicoureteral reflux remains  Imaging ct suggest early abscess formation   Reasonable to transition to oral beta-lactam  Given ct finding/severe disease would do a longer course than the typical 10-14 days  Plan: Transition to cefadroxil 1 gram bid. Duration 3 more weeks starting 11/16-12/07 If recurrent uti would consider urologic evaluation Will see in id clinic 12/6 @ 230pm Reasonable to discharge from id standpoint Discussed with primary team     I spent 75 minute reviewing data/chart, and coordinating care and >50% direct face to face time providing counseling/discussing diagnostics/treatment plan with patient  ------------------------------------------------ Principal Problem:   Pyelonephritis    HPI: Colleen Young is a 21 y.o. female bur[place] origin, otherwise healthy here with 2 weeks urinary tract infection sx that eventually progressed to pyelo    Patient speaks english and provide her own hx which is corroborated with chart/sign out  She has lower urinary tract sx for several days which she take otc azo for. She eventually developed nausea/vomiting and chill/fever/malaise and came after almost 2 weeks of sx duration  No hematuria No flank pain No rash/joint pain  No prior uti  No family hx gu problem including stone  On arrival high fever and brief hypotension that is quickly fluid responsive Bcx and ucx all negative Ct suggest early renal abscess; bilateral renal  involvement Persistent fever into HD #4 despite what appears to be appropriate empiric abx  Today hd#5 felt well; no n/v; eating well; leukocytosis resolved and fever had resolved as of hd#4  She wants to go home  A1c testing in the 4's    History reviewed. No pertinent family history.  Social History   Tobacco Use   Smoking status: Never   Smokeless tobacco: Never    No Known Allergies  Review of Systems: ROS All Other ROS was negative, except mentioned above   History reviewed. No pertinent past medical history.     Scheduled Meds:  ciprofloxacin  500 mg Oral BID   enoxaparin (LOVENOX) injection  40 mg Subcutaneous Q24H   polyethylene glycol  17 g Oral Daily   Continuous Infusions: PRN Meds:.acetaminophen **OR** acetaminophen, ibuprofen, melatonin, ondansetron **OR** ondansetron (ZOFRAN) IV   OBJECTIVE: Blood pressure 109/70, pulse 71, temperature 99 F (37.2 C), temperature source Oral, resp. rate 20, height 4\' 11"  (1.499 m), weight 49.9 kg, SpO2 98 %.  Physical Exam  General/constitutional: no distress, pleasant HEENT: Normocephalic, PER, Conj Clear, EOMI, Oropharynx clear Neck supple CV: rrr no mrg Lungs: clear to auscultation, normal respiratory effort Abd: Soft, Nontender Ext: no edema Skin: No Rash Neuro: nonfocal Gu: no cva tenderness MSK: no peripheral joint swelling/tenderness/warmth; back spines nontender    Lab Results Lab Results  Component Value Date   WBC 8.1 06/04/2022   HGB 11.3 (L) 06/04/2022   HCT 34.9 (L) 06/04/2022   MCV 91.8 06/04/2022   PLT 298 06/04/2022    Lab Results  Component Value Date  CREATININE 0.50 06/04/2022   BUN 7 06/04/2022   NA 135 06/04/2022   K 3.9 06/04/2022   CL 105 06/04/2022   CO2 22 06/04/2022    Lab Results  Component Value Date   ALT 12 06/02/2022   AST 14 (L) 06/02/2022   ALKPHOS 46 06/02/2022   BILITOT 0.5 06/02/2022      Microbiology: Recent Results (from the past 240 hour(s))   Resp Panel by RT-PCR (Flu A&B, Covid) Anterior Nasal Swab     Status: None   Collection Time: 05/31/22 12:47 PM   Specimen: Anterior Nasal Swab  Result Value Ref Range Status   SARS Coronavirus 2 by RT PCR NEGATIVE NEGATIVE Final    Comment: (NOTE) SARS-CoV-2 target nucleic acids are NOT DETECTED.  The SARS-CoV-2 RNA is generally detectable in upper respiratory specimens during the acute phase of infection. The lowest concentration of SARS-CoV-2 viral copies this assay can detect is 138 copies/mL. A negative result does not preclude SARS-Cov-2 infection and should not be used as the sole basis for treatment or other patient management decisions. A negative result may occur with  improper specimen collection/handling, submission of specimen other than nasopharyngeal swab, presence of viral mutation(s) within the areas targeted by this assay, and inadequate number of viral copies(<138 copies/mL). A negative result must be combined with clinical observations, patient history, and epidemiological information. The expected result is Negative.  Fact Sheet for Patients:  BloggerCourse.com  Fact Sheet for Healthcare Providers:  SeriousBroker.it  This test is no t yet approved or cleared by the [place] FDA and  has been authorized for detection and/or diagnosis of SARS-CoV-2 by FDA under an Emergency Use Authorization (EUA). This EUA will remain  in effect (meaning this test can be used) for the duration of the COVID-19 declaration under Section 564(b)(1) of the Act, 21 U.S.C.section 360bbb-3(b)(1), unless the authorization is terminated  or revoked sooner.       Influenza A by PCR NEGATIVE NEGATIVE Final   Influenza B by PCR NEGATIVE NEGATIVE Final    Comment: (NOTE) The Xpert Xpress SARS-CoV-2/FLU/RSV plus assay is intended as an aid in the diagnosis of influenza from Nasopharyngeal swab specimens and should not be used as a  sole basis for treatment. Nasal washings and aspirates are unacceptable for Xpert Xpress SARS-CoV-2/FLU/RSV testing.  Fact Sheet for Patients: BloggerCourse.com  Fact Sheet for Healthcare Providers: SeriousBroker.it  This test is not yet approved or cleared by the [place] FDA and has been authorized for detection and/or diagnosis of SARS-CoV-2 by FDA under an Emergency Use Authorization (EUA). This EUA will remain in effect (meaning this test can be used) for the duration of the COVID-19 declaration under Section 564(b)(1) of the Act, 21 U.S.C. section 360bbb-3(b)(1), unless the authorization is terminated or revoked.  Performed at [facility], [address], [place], [place] [address]   Blood culture (routine x 2)     Status: None (Preliminary result)   Collection Time: 05/31/22  1:01 PM   Specimen: BLOOD  Result Value Ref Range Status   Specimen Description BLOOD RIGHT ANTECUBITAL  Final   Special Requests   Final    BOTTLES DRAWN AEROBIC AND ANAEROBIC Blood Culture adequate volume   Culture   Final    NO GROWTH 4 DAYS Performed at [facility], [address], [place], [place] [address]    Report Status PENDING  Incomplete  Blood culture (routine x 2)     Status: None (Preliminary  result)   Collection Time: 05/31/22  2:47 PM   Specimen: BLOOD LEFT ARM  Result Value Ref Range Status   Specimen Description BLOOD LEFT ARM  Final   Special Requests   Final    BOTTLES DRAWN AEROBIC AND ANAEROBIC Blood Culture results may not be optimal due to an excessive volume of blood received in culture bottles   Culture   Final    NO GROWTH 4 DAYS Performed at [facility], [address], [place], [place] [address]    Report Status PENDING  Incomplete  Urine Culture     Status: Abnormal   Collection Time: 05/31/22  6:44 PM   Specimen: Urine, Clean Catch  Result Value Ref Range Status   Specimen Description  URINE, CLEAN CATCH  Final   Special Requests NONE  Final   Culture (A)  Final    <10,000 COLONIES/mL INSIGNIFICANT GROWTH Performed at [facility], [address] [place], [place] [address]    Report Status 06/02/2022 FINAL  Final  Urine Culture     Status: None   Collection Time: 06/02/22 11:05 AM   Specimen: Urine, Clean Catch  Result Value Ref Range Status   Specimen Description URINE, CLEAN CATCH  Final   Special Requests NONE  Final   Culture   Final    NO GROWTH Performed at [facility], [address] [place], [place] [address]    Report Status 06/04/2022 FINAL  Final     Serology:    Imaging: If present, new imagings (plain films, ct scans, and mri) have been personally visualized and interpreted; radiology reports have been reviewed. Decision making incorporated into the Impression / Recommendations.   11/15 renal u/s Narrative & Impression  CLINICAL DATA:  Right renal abscess on CT   EXAM: RENAL / URINARY TRACT ULTRASOUND COMPLETE   COMPARISON:  CT abdomen/pelvis dated 05/31/2022   FINDINGS: Right Kidney:   Renal measurements: 12.8 x 4.2 x 4.9 cm = volume: 139 mL. Echogenicity within normal limits. No mass or hydronephrosis visualized.   Left Kidney:   Renal measurements: 11.5 x 5.4 x 4.6 cm = volume: 148 mL. Echogenicity within normal limits. No mass or hydronephrosis visualized.   Bladder:   Appears normal for degree of bladder distention.   Other:   None.   IMPRESSION: Negative renal ultrasound. Specifically, no fluid collection/abscess is visualized in the right kidney. No hydronephrosis.    11/12 ct abd pelv with contrast 1. Bilateral striated nephrograms, right greater than left, compatible with pyelonephritis. Lobar nephronia versus small abscess noted within the anterior cortex of the upper pole of right kidney. 2. Mild bladder wall thickening with urothelial enhancement compatible with cystitis.  Raymondo Band,  MD [facility] [phone] pager    06/04/2022, 11:45 AM

## 2022-06-04 NOTE — Discharge Summary (Addendum)
Name: Colleen Young MRN: [id number] DOB: July 01, 2001 21 y.o. PCP: Pcp, No  Date of Admission: 05/31/2022 12:39 PM Date of Discharge: 06/04/2022 Attending Physician: Dickie La, MD  Discharge Diagnosis: 1. Principal Problem:   Pyelonephritis Active Problems:   Severe sepsis without septic shock [facility])  Discharge Medications: Allergies as of 06/04/2022   No Known Allergies      Medication List     STOP taking these medications    CYSTEX PO       TAKE these medications    acetaminophen 500 MG tablet Commonly known as: TYLENOL Take 1,000 mg by mouth every 6 (six) hours as needed for moderate pain or mild pain.   cefadroxil 500 MG capsule Commonly known as: DURICEF Take 2 capsules (1,000 mg total) by mouth 2 (two) times daily.   ibuprofen 200 MG tablet Commonly known as: ADVIL Take 400 mg by mouth every 6 (six) hours as needed for mild pain or moderate pain.        Disposition and follow-up:   Colleen Young was discharged from [facility] in Good condition.  At the hospital follow up visit please address:  1.  Bilateral pyelonephritis: Patient discharged with cefadroxil 500 mg twice daily for 3 weeks. Will follow up with ID on 12/6 at 2:30pm. If recurrence of UTI, needs urologic workup to rule out vesicoureteral reflux. Please follow up to ensure no return of symptoms, completion of antibiotic course, and follow up with ID.  Normocytic anemia: Currently menstruating. Did not receive iron during discharge. May need oral iron supplementation as outpatient. F/u with CBC per provider discretion.  AKI: Resolved with fluids.   2.  Labs / imaging needed at time of follow-up: CBC, iron panel, BMP  3.  Pending labs/ test needing follow-up: none  Follow-up Appointments:  Follow-up Information     Nooruddin, Jason Fila, MD Follow up.   Why: TIME : 9:45 AM DATE: NOVEMBER 30 ,2023 LOCATION : [facility] INTERNAL MEDICINE , ENTRANCE -A , GROUND FLOOR [address] [place] , [place] [address] Contact information: [address] [place] [address] [phone]         Raymondo Band, MD Follow up on 06/24/2022.   Specialty: Infectious Diseases Why: At 2:30 PM. Contact information: [address] [place] [address] [phone]                 Hospital Course by problem list: 1. Bilateral pyelonephritis: Patient presented with 2 weeks of urinary symptoms including burning with urination and flank pain. Also with nausea and vomiting. Self-treated with azo and cystex. Two weeks into symptoms, developed fever and chills, prompting ED visit. Denied hematuria, rashes, joint pain, weight loss, bug bites, foreign travel, hx of heart murmurs or dental disease. No history of UTI. High fever, leukocytosis at presentation. UA with leukocytes but no nitrites, rare bacteria. Blood cultures, urine cultures negative. CT demonstrated bilateral pyelonephritis and early renal abscess at right kidney. Improved clinically with fluids and ceftriaxone. Repeat renal US without inflammation/abscess. Chlamydia/GC cytology negative. However, continued with one or two febrile episodes daily. Switched to [employer] briefly. ID consulted and felt ascending involvement due to lack of early treatment. Got dose of ciprofloxacin day of discharge. Discharged with cefadroxil 1 g bid for 3 weeks (end date 12/07). If recurrent UTI, consider urologic evaluation. Patient will follow up with infectious disease.  Discharge Exam:   BP 109/70 (BP Location: Left Arm)   Pulse 71  Temp 99 F (37.2 C) (Oral)   Resp 20   Ht 4\' 11"  (1.499 m)   Wt 49.9 kg   SpO2 98%   BMI 22.22 kg/m  Discharge exam:  Physical Exam Constitutional:      General: She is not in acute distress. HENT:     Head: Normocephalic and atraumatic.  Eyes:     Extraocular Movements: Extraocular movements intact.  Cardiovascular:     Rate and Rhythm: Normal rate and regular rhythm.     Heart  sounds: No murmur heard. Pulmonary:     Effort: Pulmonary effort is normal.  Abdominal:     General: There is no distension.     Palpations: Abdomen is soft.     Tenderness: There is abdominal tenderness.  Skin:    General: Skin is warm and dry.  Neurological:     Mental Status: She is alert and oriented to person, place, and time.  Psychiatric:        Mood and Affect: Mood normal.        Behavior: Behavior normal.      Pertinent Labs, Studies, and Procedures:  US RENAL  Result Date: 06/03/2022 CLINICAL DATA:  Right renal abscess on CT EXAM: RENAL / URINARY TRACT ULTRASOUND COMPLETE COMPARISON:  CT abdomen/pelvis dated 05/31/2022 FINDINGS: Right Kidney: Renal measurements: 12.8 x 4.2 x 4.9 cm = volume: 139 mL. Echogenicity within normal limits. No mass or hydronephrosis visualized. Left Kidney: Renal measurements: 11.5 x 5.4 x 4.6 cm = volume: 148 mL. Echogenicity within normal limits. No mass or hydronephrosis visualized. Bladder: Appears normal for degree of bladder distention. Other: None. IMPRESSION: Negative renal ultrasound. Specifically, no fluid collection/abscess is visualized in the right kidney. No hydronephrosis. Electronically Signed   By: Julian Hy M.D.   On: 06/03/2022 02:03   CT ABDOMEN PELVIS W CONTRAST  Result Date: 05/31/2022 CLINICAL DATA:  Urinary tract infection. Complains of nausea, fever and headache and abdominal pain for 2 days. Diaphoretic and pale. EXAM: CT ABDOMEN AND PELVIS WITH CONTRAST TECHNIQUE: Multidetector CT imaging of the abdomen and pelvis was performed using the standard protocol following bolus administration of intravenous contrast. RADIATION DOSE REDUCTION: This exam was performed according to the departmental dose-optimization program which includes automated exposure control, adjustment of the mA and/or kV according to patient size and/or use of iterative reconstruction technique. CONTRAST:  25mL OMNIPAQUE IOHEXOL 350 MG/ML SOLN  COMPARISON:  None Available. FINDINGS: Lower chest: No acute abnormality. Hepatobiliary: No focal liver abnormality is seen. No gallstones, gallbladder wall thickening, or biliary dilatation. Pancreas: Unremarkable. No pancreatic ductal dilatation or surrounding inflammatory changes. Spleen: Normal in size without focal abnormality. Adrenals/Urinary Tract: Normal appearance of the adrenal glands. No nephrolithiasis. Bilateral striated nephrograms are identified, right greater than left. Lobar nephronia versus small abscess noted within the anterior cortex of the upper pole of right kidney measures 1.2 x 2.3 cm, image 33/3. Right-sided pelvocaliectasis. Bilateral urothelial enhancement is noted involving both ureters. Partially decompressed bladder with mild wall thickening. Stomach/Bowel: Stomach appears normal. The appendix is visualized and is within normal limits. No bowel wall thickening, inflammation or distension. Vascular/Lymphatic: Normal appearance of the abdominal aorta. No sign of abdominopelvic adenopathy. Reproductive: Uterus and bilateral adnexa are unremarkable. Other: No abdominal wall hernia or abnormality. No abdominopelvic ascites. Musculoskeletal: No acute or significant osseous findings. IMPRESSION: 1. Bilateral striated nephrograms, right greater than left, compatible with pyelonephritis. Lobar nephronia versus small abscess noted within the anterior cortex of the upper pole of right kidney.  2. Mild bladder wall thickening with urothelial enhancement compatible with cystitis. Electronically Signed   By: Kerby Moors M.D.   On: 05/31/2022 16:48   DG Chest 2 View  Result Date: 05/31/2022 CLINICAL DATA:  Cough and fever.  Abdominal pain for 2 days. EXAM: CHEST - 2 VIEW COMPARISON:  08/21/2008. FINDINGS: Normal heart, mediastinum and hila. Clear lungs.  No pleural effusion or pneumothorax. Skeletal structures are within normal limits. IMPRESSION: Normal chest radiographs. Electronically  Signed   By: Lajean Manes M.D.   On: 05/31/2022 13:48     Discharge Instructions: Discharge Instructions     Discharge instructions   Complete by: As directed    Colleen Young,  It was a pleasure to care for you during your stay at [facility] were admitted due to a severe infection of your kidneys, but are getting better!  You will be on an antibiotic for 3 weeks when you go home called cefadroxil. You will take 1000 mg twice daily. You will follow up with the infectious disease doctors on December 6 at 2:30 PM. You also have an appointment to establish primary care at the [facility] on November 30 at 9:45 AM.  My best, Dr. Marlou Sa       Signed: Linward Natal, MD 06/04/2022, 1:07 PM   Pager: [phone]

## 2022-06-04 NOTE — Progress Notes (Addendum)
HD#4 Subjective:   Summary: Colleen Young is a 21 y.o. female with no significant past medical history who presented to the ED with fever, vomiting, and dysuria.   Overnight Events: Febrile to 102.3 at 8pm.   Patient continues to endorse improvement today. Denies headache or neck pain. Endorses some pain at her right lower quadrant. Endorses menstruation currently. Denies dysuria. Denies back pain. Endorses good hydration and nutrition. She would like to go to our [facility] for primary care needs.  Objective:  Vital signs in last 24 hours: Vitals:   06/03/22 1324 06/03/22 2025 06/04/22 0005 06/04/22 0352  BP: 115/75 113/66 104/70 98/62  Pulse: 80 89 70 (!) 52  Resp: 17 20 19 18   Temp:  (!) 102.3 F (39.1 C) 97.9 F (36.6 C) (!) 97.2 F (36.2 C)  TempSrc:  Oral Oral Oral  SpO2: 99% 98% 99% 99%  Weight:      Height:       Supplemental O2: Room Air SpO2: 99 %   Physical Exam:  Constitutional: well-appearing female sitting in hospital bed, in no acute distress, non-diaphoretic HENT: normocephalic atraumatic, mucous membranes moist Eyes: conjunctiva non-erythematous Neck: supple Cardiovascular: RRR, no m/r/g Pulmonary/Chest: normal work of breathing on room air Abdominal: soft, non-distended, pain with palpation of right lower quadrant Neurological: alert & oriented x 3 Skin: warm and dry Psych: Normal mood and affect  Filed Weights   05/31/22 1512  Weight: 49.9 kg     Intake/Output Summary (Last 24 hours) at 06/04/2022 0627 Last data filed at 06/03/2022 1700 Gross per 24 hour  Intake 100 ml  Output --  Net 100 ml    Net IO Since Admission: 1,053.06 mL [06/04/22 0627]  Pertinent Labs:    Latest Ref Rng & Units 06/04/2022    4:14 AM 06/03/2022    8:01 AM 06/02/2022    2:58 AM  CBC  WBC 4.0 - 10.5 K/uL 8.1  9.7  16.6   Hemoglobin 12.0 - 15.0 g/dL 06/04/2022  40.0  9.7   Hematocrit 36.0 - 46.0 % 34.9  34.3  29.6   Platelets 150 - 400 K/uL 298   261  199        Latest Ref Rng & Units 06/04/2022    4:14 AM 06/03/2022    8:01 AM 06/02/2022    2:58 AM  CMP  Glucose 70 - 99 mg/dL 98  95  06/04/2022   BUN 6 - 20 mg/dL 7  6  5    Creatinine 0.44 - 1.00 mg/dL 619   5.09   Sodium 135 - 145 mmol/L 135  138  138   Potassium 3.5 - 5.1 mmol/L 3.9  3.9  3.3   Chloride 98 - 111 mmol/L 105  104  108   CO2 22 - 32 mmol/L 22  24  23    Calcium 8.9 - 10.3 mg/dL 8.6  8.9  8.4   Total Protein 6.5 - 8.1 g/dL   5.6   Total Bilirubin 0.3 - 1.2 mg/dL   0.5   Alkaline Phos 38 - 126 U/L   46   AST 15 - 41 U/L   14   ALT 0 - 44 U/L   12     Imaging: No results found.  Assessment/Plan:   Principal Problem:   Pyelonephritis   Patient Summary: Colleen Young is a 21 y.o. female with no significant past medical history who presented to the ED with fever, vomiting, and  dysuria admitted for sepsis 2/2 bilateral pyelonephritis.   Bilateral Pyelonephritis  Cystitis Sepsis, resolved Febrile again overnight. Without leukocytosis. Cr at baseline. GC/chlamydia: negative. Repeat urine culture: no growth. Blood cultures with no growth.  Will consult infectious disease given persistent fevers despite IV antibiotics to determine length of therapy. -ID consulted, appreciate recommendations -ciprofloxacin 500 mg bid, suspect treatment for 10 days -Tylenol/IBU for fever -zofran for nausea -daily CBC  Normocytic Anemia Iron 19. Pct sat 8. Ferritin 144. Iron deficiency vs anemia of chronic disease. May need oral iron supplementation as outpatient. No immediate need for IV iron. Hgb improved today.  -daily CBC  AKI Stable. -IVF prn -daily BMP  Hypokalemia K 3.9.  -supplement as needed -daily BMP  Hyperglycemia No history of diabetes. A1c wnl.  Diet: Normal IVF: none VTE: Enoxaparin Code: Full Family Update: SIL   Dispo: Anticipated discharge to Home in 0-1 days pending medical management of pyelonephritis.   Adron Bene MD Internal  Medicine Resident PGY-1 Please contact the on call pager after 5 pm and on weekends at [phone].

## 2022-06-04 NOTE — Progress Notes (Signed)
Explained discharge instructions to patient. Reviewed follow up appointment and next medication administration times. Also reviewed education. Patient verbalized having an understanding for instructions given. All belongings are in the patient's possession. IV and telemetry were removed. CCMD was notified. No other needs verbalized. Transported downstairs for discharge. 

## 2022-06-05 LAB — CULTURE, BLOOD (ROUTINE X 2)
Culture: NO GROWTH
Culture: NO GROWTH
Special Requests: ADEQUATE

## 2022-06-18 ENCOUNTER — Encounter: Payer: Self-pay | Admitting: Student

## 2022-06-18 ENCOUNTER — Ambulatory Visit (INDEPENDENT_AMBULATORY_CARE_PROVIDER_SITE_OTHER): Payer: Medicaid Other | Admitting: Student

## 2022-06-18 VITALS — BP 120/74 | HR 80 | Temp 97.9°F | Ht 59.0 in | Wt 110.1 lb

## 2022-06-18 DIAGNOSIS — D649 Anemia, unspecified: Secondary | ICD-10-CM

## 2022-06-18 DIAGNOSIS — Z23 Encounter for immunization: Secondary | ICD-10-CM | POA: Diagnosis not present

## 2022-06-18 DIAGNOSIS — N12 Tubulo-interstitial nephritis, not specified as acute or chronic: Secondary | ICD-10-CM | POA: Diagnosis not present

## 2022-06-18 NOTE — Patient Instructions (Signed)
Thank you so much for coming to the clinic today! Really glad to hear that you're doing well! We're going to get some blood work today to look for any cause of anemia, and I will call you with the results. Please make sure you go to the Infectious Disease doctors on 12/6 at 2:30, and continue taking your antibiotics!   It's a pleasure to meet you, and if you have any questions please feel free to the call the clinic at anytime at [phone]!  Best, Dr. Thomasene Ripple

## 2022-06-18 NOTE — Progress Notes (Signed)
CC: Hospital follow up  HPI:  [name] is a 21 y.o. female living with a history stated below and presents today for a hospital follow up. Please see problem based assessment and plan for additional details.  No past medical history on file.  Current Outpatient Medications on File Prior to Visit  Medication Sig Dispense Refill   acetaminophen (TYLENOL) 500 MG tablet Take 1,000 mg by mouth every 6 (six) hours as needed for moderate pain or mild pain.     cefadroxil (DURICEF) 500 MG capsule Take 2 capsules (1,000 mg total) by mouth 2 (two) times daily. 84 capsule 0   cefadroxil (DURICEF) 500 MG capsule Take 2 capsules (1,000 mg total) by mouth 2 (two) times daily for 21 days. 84 capsule 0   ibuprofen (ADVIL) 200 MG tablet Take 400 mg by mouth every 6 (six) hours as needed for mild pain or moderate pain.     No current facility-administered medications on file prior to visit.    No family history on file.  Social History   Socioeconomic History   Marital status: Single    Spouse name: Not on file   Number of children: Not on file   Years of education: Not on file   Highest education level: Not on file  Occupational History   Not on file  Tobacco Use   Smoking status: Never   Smokeless tobacco: Never  Substance and Sexual Activity   Alcohol use: Not on file   Drug use: Not on file   Sexual activity: Not on file  Other Topics Concern   Not on file  Social History Narrative   Not on file   Social Determinants of Health   Financial Resource Strain: Not on file  Food Insecurity: Not on file  Transportation Needs: Not on file  Physical Activity: Not on file  Stress: Not on file  Social Connections: Not on file  Intimate Partner Violence: Not on file    Review of Systems: ROS negative except for what is noted on the assessment and plan.  Vitals:   06/18/22 1024  BP: 120/74  Pulse: 80  Temp: 97.9 F (36.6 C)  TempSrc: Oral  SpO2: 99%  Weight: 110 lb 1.6  oz (49.9 kg)  Height: 4\' 11"  (1.499 m)    Physical Exam: Constitutional: well-appearing female sitting comfortably, in no acute distress Cardiovascular: regular rate and rhythm, no m/r/g Pulmonary/Chest: normal work of breathing on room air, lungs clear to auscultation bilaterally Abdominal: soft, non-tender, non-distended MSK: normal bulk and tone Skin: warm and dry  Assessment & Plan:   Pyelonephritis Pt presents to the clinic for hospital follow-up for bilateral pyelonephritis.  She is currently still taking the cefadroxil 500 mg twice daily, she has 1 week left of treatment.  She has follow-up with infectious disease on December 6 at 2:30 PM, which she intends on going to.  She denies any dysuria, abdominal pain, nausea, vomiting, hematuria, fevers, polyuria, and chest pain and shortness of breath.  She has been able to tolerate a normal diet since she left the hospital.  Overall patient is feeling well, with no complaints.  Plan: - Infectious disease appointment on December 6 - Informed patient if she does have recurrent symptoms to present to the clinic for further evaluation.  Normocytic anemia While in the hospital, patient was noted to be anemic with a hemoglobin of 11.3.  She was menstruating at the time, which could have led to her anemia.  She is asymptomatic, denies any dizziness, or orthostatics.  Will get iron panel, CBC, B12, folate to further evaluate in a nonacute setting.  Plan: - Will follow-up on laboratory testing, and we will decide at that time if therapy is necessary  Patient seen with Dr. Lynford Humphrey, M.D. [facility] Internal Medicine, PGY-1 Date 06/18/2022 Time 5:42 PM

## 2022-06-18 NOTE — Assessment & Plan Note (Addendum)
While in the hospital, patient was noted to be anemic with a hemoglobin of 11.3.  She was menstruating at the time, which could have led to her anemia.  She is asymptomatic, denies any dizziness, or orthostatics.  Will get iron panel, CBC, B12, folate to further evaluate in a nonacute setting.  Plan: - Will follow-up on laboratory testing, and we will decide at that time if therapy is necessary  ADDENDUM 06/22/22:  Iron studies within normal limits, except sat which is mildly low. Hb within normal limits. Will need follow up in a year at next appointment.

## 2022-06-18 NOTE — Assessment & Plan Note (Signed)
Pt presents to the clinic for hospital follow-up for bilateral pyelonephritis.  She is currently still taking the cefadroxil 500 mg twice daily, she has 1 week left of treatment.  She has follow-up with infectious disease on December 6 at 2:30 PM, which she intends on going to.  She denies any dysuria, abdominal pain, nausea, vomiting, hematuria, fevers, polyuria, and chest pain and shortness of breath.  She has been able to tolerate a normal diet since she left the hospital.  Overall patient is feeling well, with no complaints.  Plan: - Infectious disease appointment on December 6 - Informed patient if she does have recurrent symptoms to present to the clinic for further evaluation.

## 2022-06-20 LAB — CBC
Hematocrit: 39.4 % (ref 34.0–46.6)
Hemoglobin: 12.8 g/dL (ref 11.1–15.9)
MCH: 29.5 pg (ref 26.6–33.0)
MCHC: 32.5 g/dL (ref 31.5–35.7)
MCV: 91 fL (ref 79–97)
Platelets: 428 10*3/uL (ref 150–450)
RBC: 4.34 x10E6/uL (ref 3.77–5.28)
RDW: 12.4 % (ref 11.7–15.4)
WBC: 5.5 10*3/uL (ref 3.4–10.8)

## 2022-06-20 LAB — VITAMIN B12: Vitamin B-12: 581 pg/mL (ref 232–1245)

## 2022-06-20 LAB — IRON,TIBC AND FERRITIN PANEL
Ferritin: 71 ng/mL (ref 15–150)
Iron Saturation: 12 % — ABNORMAL LOW (ref 15–55)
Iron: 36 ug/dL (ref 27–159)
Total Iron Binding Capacity: 311 ug/dL (ref 250–450)
UIBC: 275 ug/dL (ref 131–425)

## 2022-06-20 LAB — FOLATE: Folate: 11.4 ng/mL (ref >3.0)

## 2022-06-22 ENCOUNTER — Telehealth: Payer: Self-pay | Admitting: Student

## 2022-06-22 NOTE — Telephone Encounter (Signed)
Attempted to call patient twice regarding results. CBC without signs of iron deficiency, iron saturation low, everything else within normal limits. Will not treat at this time, will follow up in one year.

## 2022-06-23 NOTE — Progress Notes (Signed)
Internal Medicine Clinic Attending  I saw and evaluated the patient.  I personally confirmed the key portions of the history and exam documented by Dr. Nooruddin and I reviewed pertinent patient test results.  The assessment, diagnosis, and plan were formulated together and I agree with the documentation in the resident's note.  

## 2022-06-23 NOTE — Addendum Note (Signed)
Addended by: Burnell Blanks on: 06/23/2022 03:27 PM   Modules accepted: Level of Service

## 2022-06-24 ENCOUNTER — Other Ambulatory Visit: Payer: Self-pay

## 2022-06-24 ENCOUNTER — Encounter: Payer: Self-pay | Admitting: Internal Medicine

## 2022-06-24 ENCOUNTER — Ambulatory Visit (INDEPENDENT_AMBULATORY_CARE_PROVIDER_SITE_OTHER): Payer: Medicaid Other | Admitting: Internal Medicine

## 2022-06-24 VITALS — BP 136/96 | HR 66 | Temp 98.3°F | Resp 16 | Ht 59.0 in | Wt 112.0 lb

## 2022-06-24 DIAGNOSIS — N151 Renal and perinephric abscess: Secondary | ICD-10-CM

## 2022-06-24 DIAGNOSIS — N12 Tubulo-interstitial nephritis, not specified as acute or chronic: Secondary | ICD-10-CM

## 2022-06-24 NOTE — Patient Instructions (Addendum)
You should have adequate antibiotics for you kidneys infection  If within the next 4-6 weeks you have fever chill, nausea, vomiting, flank pain, pain with urinating or urgency/frequency, or bloody urination, malaise please call our clinic

## 2022-06-24 NOTE — Progress Notes (Signed)
[facility]  Patient Active Problem List   Diagnosis Date Noted   Normocytic anemia 06/18/2022   Severe sepsis without septic shock (HCC) 06/04/2022   Pyelonephritis 05/31/2022   Failed vision screen 05/28/2016      Subjective:    Patient ID: Colleen Young, female    DOB: August 05, 2000, 21 y.o.   MRN: [id number]  Chief Complaint  Patient presents with   Hospitalization Follow-up    Bilateral pyelo - early renal abscess - patient reports she is doing better since discharged - sometimes has intermittent nausea.     HPI:  Colleen Young is a 21 y.o. female here for f/u hospital admission for bilateral pyelo with right kidney imaging ct suggestion early renal abscess  Patient had about 5 days abx in the hospital Ucx obtained after abx has <10k insignificant growth not worked up [employer] negative  She was discharged on 3 more weeks cefadroxil  She is doing well No n/v/diarrhea/rash  No Known Allergies    Outpatient Medications Prior to Visit  Medication Sig Dispense Refill   acetaminophen (TYLENOL) 500 MG tablet Take 1,000 mg by mouth every 6 (six) hours as needed for moderate pain or mild pain.     cefadroxil (DURICEF) 500 MG capsule Take 2 capsules (1,000 mg total) by mouth 2 (two) times daily. 84 capsule 0   cefadroxil (DURICEF) 500 MG capsule Take 2 capsules (1,000 mg total) by mouth 2 (two) times daily for 21 days. 84 capsule 0   ibuprofen (ADVIL) 200 MG tablet Take 400 mg by mouth every 6 (six) hours as needed for mild pain or moderate pain.     No facility-administered medications prior to visit.     Social History   Socioeconomic History   Marital status: Single    Spouse name: Not on file   Number of children: Not on file   Years of education: Not on file   Highest education level: Not on file  Occupational History   Not on file  Tobacco Use   Smoking status: Never   Smokeless tobacco: Never  Substance and Sexual Activity    Alcohol use: Not on file   Drug use: Not on file   Sexual activity: Not on file  Other Topics Concern   Not on file  Social History Narrative   Not on file   Social Determinants of Health   Financial Resource Strain: Not on file  Food Insecurity: Not on file  Transportation Needs: Not on file  Physical Activity: Not on file  Stress: Not on file  Social Connections: Not on file  Intimate Partner Violence: Not on file      Review of Systems     Objective:    Resp 16   Ht 4\' 11"  (1.499 m)   Wt 112 lb (50.8 kg)   BMI 22.62 kg/m  Nursing note and vital signs reviewed.  Physical Exam     General/constitutional: no distress, pleasant HEENT: Normocephalic, PER, Conj Clear, EOMI, Oropharynx clear Neck supple CV: rrr no mrg Lungs: clear to auscultation, normal respiratory effort Abd: Soft, Nontender Ext: no edema Skin: No Rash Neuro: nonfocal MSK: no peripheral joint swelling/tenderness/warmth; back spines nontender  Gu: no cva tenderness    Labs:  Micro:  Serology:  Imaging:  Assessment & Plan:   Problem List Items Addressed This Visit       Genitourinary   Pyelonephritis - Primary   Other Visit  Diagnoses     Renal abscess             No orders of the defined types were placed in this encounter.  11/12 ct abd pelv with contrast 1. Bilateral striated nephrograms, right greater than left, compatible with pyelonephritis. Lobar nephronia versus small abscess noted within the anterior cortex of the upper pole of right kidney. 2. Mild bladder wall thickening with urothelial enhancement compatible with cystitis.  Abx: 11/16-c cefadroxil  11/16 cipro 11/13-15 piptazo 11/12-13 ceftriaxone                                                      Assessment: Young 21 yo female otherwise healthy here with 2 weeks urinary tract infection sx that eventually progressed to pyelo bilaterally with imaging suggestion early abscess right kidney     First episode, no hx stone   Clinically improved. Perhaps ascending involvement due to lack of early tx, but some question of vessicoureteral reflux remains   Imaging ct suggest early abscess formation    Reasonable to transition to oral beta-lactam   Given ct finding/severe disease would do a longer course than the typical 10-14 days  ----------- 06/24/22 id assessment Doing well on abx. Clinically appear infection treated. Will finish her 4th week of abx tomorrow No abx side effect  Advise to monitor for f/c, n/v/flank pain, malaise and if occurs within the next 4-6 weeks to call our clinic, and we'll plan to get abd pelv ct again      Follow-up: Return if symptoms worsen or fail to improve.      Raymondo Band, MD [facility] 06/24/2022, 2:11 PM

## 2023-01-19 ENCOUNTER — Telehealth: Payer: Self-pay

## 2023-01-19 NOTE — Telephone Encounter (Signed)
LVM for patient to call back [phone], or to call PCP office to schedule follow up apt. AS, CMA  

## 2023-03-21 DIAGNOSIS — Z419 Encounter for procedure for purposes other than remedying health state, unspecified: Secondary | ICD-10-CM | POA: Diagnosis not present

## 2023-04-20 DIAGNOSIS — Z419 Encounter for procedure for purposes other than remedying health state, unspecified: Secondary | ICD-10-CM | POA: Diagnosis not present

## 2023-05-21 DIAGNOSIS — Z419 Encounter for procedure for purposes other than remedying health state, unspecified: Secondary | ICD-10-CM | POA: Diagnosis not present

## 2023-06-20 DIAGNOSIS — Z419 Encounter for procedure for purposes other than remedying health state, unspecified: Secondary | ICD-10-CM | POA: Diagnosis not present

## 2023-07-21 DIAGNOSIS — Z419 Encounter for procedure for purposes other than remedying health state, unspecified: Secondary | ICD-10-CM | POA: Diagnosis not present

## 2023-08-01 DIAGNOSIS — Z419 Encounter for procedure for purposes other than remedying health state, unspecified: Secondary | ICD-10-CM | POA: Diagnosis not present

## 2023-08-21 DIAGNOSIS — Z419 Encounter for procedure for purposes other than remedying health state, unspecified: Secondary | ICD-10-CM | POA: Diagnosis not present

## 2023-09-18 DIAGNOSIS — Z419 Encounter for procedure for purposes other than remedying health state, unspecified: Secondary | ICD-10-CM | POA: Diagnosis not present

## 2023-10-30 DIAGNOSIS — Z419 Encounter for procedure for purposes other than remedying health state, unspecified: Secondary | ICD-10-CM | POA: Diagnosis not present

## 2023-11-29 DIAGNOSIS — Z419 Encounter for procedure for purposes other than remedying health state, unspecified: Secondary | ICD-10-CM | POA: Diagnosis not present
